# Patient Record
Sex: Female | Born: 1990 | Race: Black or African American | Hispanic: No | Marital: Single
Health system: Midwestern US, Community
[De-identification: ages and names within clinical notes are randomized; demographics above are authoritative.]

## PROBLEM LIST (undated history)

## (undated) ENCOUNTER — Inpatient Hospital Stay (HOSPITAL_COMMUNITY): Payer: Self-pay

## (undated) DIAGNOSIS — I10 Essential (primary) hypertension: Secondary | ICD-10-CM

## (undated) DIAGNOSIS — R569 Unspecified convulsions: Secondary | ICD-10-CM

## (undated) DIAGNOSIS — J45909 Unspecified asthma, uncomplicated: Secondary | ICD-10-CM

## (undated) DIAGNOSIS — I340 Nonrheumatic mitral (valve) insufficiency: Secondary | ICD-10-CM

## (undated) DIAGNOSIS — A749 Chlamydial infection, unspecified: Secondary | ICD-10-CM

## (undated) DIAGNOSIS — O99412 Diseases of the circulatory system complicating pregnancy, second trimester: Secondary | ICD-10-CM

## (undated) HISTORY — PX: NO PAST SURGERIES: SHX2092

---

## 2017-05-31 ENCOUNTER — Emergency Department (HOSPITAL_COMMUNITY): Payer: Medicaid Other

## 2017-05-31 ENCOUNTER — Encounter (HOSPITAL_COMMUNITY): Payer: Self-pay | Admitting: Emergency Medicine

## 2017-05-31 ENCOUNTER — Emergency Department (HOSPITAL_COMMUNITY)
Admission: EM | Admit: 2017-05-31 | Discharge: 2017-05-31 | Disposition: A | Discharge status: Home / Self Care | Payer: Medicaid Other | Attending: Physician Assistant | Admitting: Physician Assistant

## 2017-05-31 DIAGNOSIS — Z87891 Personal history of nicotine dependence: Secondary | ICD-10-CM | POA: Diagnosis not present

## 2017-05-31 DIAGNOSIS — J45909 Unspecified asthma, uncomplicated: Secondary | ICD-10-CM | POA: Diagnosis not present

## 2017-05-31 DIAGNOSIS — J069 Acute upper respiratory infection, unspecified: Secondary | ICD-10-CM | POA: Diagnosis not present

## 2017-05-31 DIAGNOSIS — R079 Chest pain, unspecified: Secondary | ICD-10-CM | POA: Diagnosis present

## 2017-05-31 HISTORY — DX: Unspecified convulsions: R56.9

## 2017-05-31 HISTORY — DX: Unspecified asthma, uncomplicated: J45.909

## 2017-05-31 LAB — CBC
HEMATOCRIT: 41.2 % (ref 36.0–46.0)
HEMOGLOBIN: 14.1 g/dL (ref 12.0–15.0)
MCH: 31.5 pg (ref 26.0–34.0)
MCHC: 34.2 g/dL (ref 30.0–36.0)
MCV: 92.2 fL (ref 78.0–100.0)
Platelets: 292 10*3/uL (ref 150–400)
RBC: 4.47 MIL/uL (ref 3.87–5.11)
RDW: 12.7 % (ref 11.5–15.5)
WBC: 9.2 10*3/uL (ref 4.0–10.5)

## 2017-05-31 LAB — BASIC METABOLIC PANEL
ANION GAP: 9 (ref 5–15)
BUN: 8 mg/dL (ref 6–20)
CHLORIDE: 105 mmol/L (ref 101–111)
CO2: 23 mmol/L (ref 22–32)
Calcium: 9 mg/dL (ref 8.9–10.3)
Creatinine, Ser: 0.86 mg/dL (ref 0.44–1.00)
GFR calc Af Amer: >60 mL/min (ref >60)
GLUCOSE: 100 mg/dL — AB (ref 65–99)
POTASSIUM: 3.8 mmol/L (ref 3.5–5.1)
Sodium: 137 mmol/L (ref 135–145)

## 2017-05-31 LAB — I-STAT TROPONIN, ED: Troponin i, poc: 0 ng/mL (ref 0.00–0.08)

## 2017-05-31 MED ORDER — IPRATROPIUM-ALBUTEROL 0.5-2.5 (3) MG/3ML IN SOLN
3.0000 mL | Freq: Once | RESPIRATORY_TRACT | Status: AC
  Administered 2017-05-31: 3 mL via RESPIRATORY_TRACT
  Filled 2017-05-31: qty 3

## 2017-05-31 MED ORDER — ALBUTEROL SULFATE HFA 108 (90 BASE) MCG/ACT IN AERS
1.0000 | INHALATION_SPRAY | RESPIRATORY_TRACT | Status: DC | PRN
  Administered 2017-05-31: 1 via RESPIRATORY_TRACT
  Filled 2017-05-31: qty 6.7

## 2017-05-31 NOTE — Discharge Instructions (Signed)
Please read attached information. If you experience any new or worsening signs or symptoms please return to the emergency room for evaluation. Please follow-up with your primary care provider or specialist as discussed. Please use medication prescribed only as directed and discontinue taking if you have any concerning signs or symptoms.   °

## 2017-05-31 NOTE — ED Provider Notes (Signed)
[facility]-EMERGENCY DEPT Provider Note   CSN: [id number] Arrival date & time: 05/31/17  1123     History   Chief Complaint Chief Complaint  Patient presents with  . Chest Pain    HPI Aracelie Addis is a 26 y.o. female.  HPI  26 year old female presents today with complaints of upper respiratory infection. Patient notes that yesterday she developed rhinorrhea, congestion, cough and chest tightness. Patient has a history of asthma, reports this feels similar to her previous exacerbation. Patient denies any audible wheezing but feels tightness in her chest. She denies any specific chest pain other than that noted with deep inspiration or palpation of left anterior chest wall. Patient denies any history of DVT or PE, denies any significant risk factors, denies any lower extremity swelling or edema. Patient reports that she had an inhaler at home but has run out and has not had any medications prior to arrival. She denies any fever. Patient reports she is a smoker.    Past Medical History:  Diagnosis Date  . Asthma   . Seizures (HCC)     There are no active problems to display for this patient.   History reviewed. No pertinent surgical history.  OB History    No data available       Home Medications    Prior to Admission medications   Not on File    Family History No family history on file.  Social History Social History  Substance Use Topics  . Smoking status: Current Every Day Smoker    Packs/day: 0.50  . Smokeless tobacco: Never Used  . Alcohol use Yes     Allergies   Patient has no allergy information on record.   Review of Systems Review of Systems  All other systems reviewed and are negative.    Physical Exam Updated Vital Signs BP 134/78 (BP Location: Left Arm)   Pulse 80   Temp 98.4 F (36.9 C) (Oral)   Resp 20   LMP 05/14/2017   SpO2 97%   Physical Exam  Constitutional: She is oriented to person, place, and time. She appears  well-developed and well-nourished.  HENT:  Head: Normocephalic and atraumatic.  Nose: Rhinorrhea present.  Eyes: Pupils are equal, round, and reactive to light. Conjunctivae are normal. Right eye exhibits no discharge. Left eye exhibits no discharge. No scleral icterus.  Neck: Normal range of motion. No JVD present. No tracheal deviation present.  Cardiovascular: Normal rate, regular rhythm, normal heart sounds and intact distal pulses.  Exam reveals no gallop and no friction rub.   No murmur heard. Pulmonary/Chest: Effort normal and breath sounds normal. No stridor. No respiratory distress. She has no wheezes. She has no rales. She exhibits no tenderness.  Neurological: She is alert and oriented to person, place, and time. Coordination normal.  Psychiatric: She has a normal mood and affect. Her behavior is normal. Judgment and thought content normal.  Nursing note and vitals reviewed.    ED Treatments / Results  Labs (all labs ordered are listed, but only abnormal results are displayed) Labs Reviewed  BASIC METABOLIC PANEL - Abnormal; Notable for the following:       Result Value   Glucose, Bld 100 (*)    All other components within normal limits  CBC  I-STAT TROPONIN, ED    EKG  EKG Interpretation None       Radiology Dg Chest 2 View  Result Date: 05/31/2017 CLINICAL DATA:  Sharp anterior chest pain with a pleuritic component  and productive cough for the past 2 days. No fever or shortness of breath. Current smoker. EXAM: CHEST  2 VIEW COMPARISON:  None in PACs. FINDINGS: The heart size and mediastinal contours are within normal limits. Both lungs are clear. The visualized skeletal structures are unremarkable. Nipple or months are present bilaterally. IMPRESSION: There is no active cardiopulmonary disease. Electronically Signed   By: David  SwazilandJordan M.D.   On: 05/31/2017 11:55    Procedures Procedures (including critical care time)  Medications Ordered in ED Medications    albuterol (PROVENTIL HFA;VENTOLIN HFA) 108 (90 Base) MCG/ACT inhaler 1-2 puff (1 puff Inhalation Given 05/31/17 1450)  ipratropium-albuterol (DUONEB) 0.5-2.5 (3) MG/3ML nebulizer solution 3 mL (3 mLs Nebulization Given 05/31/17 1450)     Initial Impression / Assessment and Plan / ED Course  I have reviewed the triage vital signs and the nursing notes.  Pertinent labs & imaging results that were available during my care of the patient were reviewed by me and considered in my medical decision making (see chart for details).      Final Clinical Impressions(s) / ED Diagnoses   Final diagnoses:  Viral URI    Labs: I-STAT troponin, BMP, CBC  Imaging: DG chest 2 view  Consults:  Therapeutics:  Discharge Meds:   Assessment/Plan: 26 year old female presents today with likely viral URI. She is afebrile nontoxic. She has reassuring lung sounds. Patient was given a breathing treatment for comfort measures here. Patient's chest discomfort is chest wall, very low suspicion for PE ACS in this otherwise healthy young female. She'll be discharged home with albuterol inhaler, symptomatic care structures and strict return precautions and she verbalized understanding and agreement to today's plan had no further questions or concerns at the time discharge    New Prescriptions New Prescriptions   No medications on file     Eyvonne MechanicHedges, Shey Yott, Cordelia Poche-C 05/31/17 1522    Abelino DerrickMackuen, Courteney Lyn, MD 06/01/17 1037

## 2017-05-31 NOTE — ED Triage Notes (Signed)
Pt reports sharp CP across front of chest, worse with respiration, increased cough with clear white sputum.  Pt denies fever/chills, SOB. Resp e/u at this time.

## 2017-05-31 NOTE — ED Notes (Signed)
Pt verbalized understanding discharge instructions and denies any further needs or questions at this time. VS stable, ambulatory and steady gait.   

## 2017-10-14 ENCOUNTER — Emergency Department: Admit: 2017-10-14 | Payer: MEDICAID

## 2017-10-14 ENCOUNTER — Inpatient Hospital Stay
Admit: 2017-10-14 | Discharge: 2017-10-14 | Disposition: A | Discharge status: Home / Self Care | Payer: MEDICAID | Attending: Emergency Medicine

## 2017-10-14 DIAGNOSIS — O26891 Other specified pregnancy related conditions, first trimester: Secondary | ICD-10-CM

## 2017-10-14 LAB — URINE MICROSCOPIC ONLY
RBC: 2 /hpf (ref 0–5)
WBC: 10 /hpf (ref 0–5)

## 2017-10-14 LAB — CBC WITH AUTOMATED DIFF
ABS. BASOPHILS: 0 10*3/uL (ref 0.0–0.1)
ABS. EOSINOPHILS: 0 10*3/uL (ref 0.0–0.4)
ABS. LYMPHOCYTES: 1.8 10*3/uL (ref 0.9–3.6)
ABS. MONOCYTES: 0.6 10*3/uL (ref 0.05–1.2)
ABS. NEUTROPHILS: 3.3 10*3/uL (ref 1.8–8.0)
BASOPHILS: 0 % (ref 0–2)
EOSINOPHILS: 1 % (ref 0–5)
HCT: 35.8 % (ref 35.0–45.0)
HGB: 12.1 g/dL (ref 12.0–16.0)
LYMPHOCYTES: 32 % (ref 21–52)
MCH: 31.2 PG (ref 24.0–34.0)
MCHC: 33.8 g/dL (ref 31.0–37.0)
MCV: 92.3 FL (ref 74.0–97.0)
MONOCYTES: 10 % (ref 3–10)
MPV: 10.4 FL (ref 9.2–11.8)
NEUTROPHILS: 57 % (ref 40–73)
PLATELET: 253 10*3/uL (ref 135–420)
RBC: 3.88 M/uL — ABNORMAL LOW (ref 4.20–5.30)
RDW: 11.5 % — ABNORMAL LOW (ref 11.6–14.5)
WBC: 5.7 10*3/uL (ref 4.6–13.2)

## 2017-10-14 LAB — METABOLIC PANEL, COMPREHENSIVE
A-G Ratio: 1 (ref 0.8–1.7)
ALT (SGPT): 27 U/L (ref 13–56)
AST (SGOT): 27 U/L (ref 15–37)
Albumin: 3.3 g/dL — ABNORMAL LOW (ref 3.4–5.0)
Alk. phosphatase: 79 U/L (ref 45–117)
Anion gap: 7 mmol/L (ref 3.0–18)
BUN/Creatinine ratio: 13
BUN: 10 MG/DL (ref 7.0–18)
Bilirubin, total: 0.3 MG/DL (ref 0.2–1.0)
CO2: 24 mmol/L (ref 21–32)
Calcium: 8.3 MG/DL — ABNORMAL LOW (ref 8.5–10.1)
Chloride: 106 mmol/L (ref 100–108)
Creatinine: 0.76 MG/DL (ref 0.6–1.3)
GFR est AA: >60 mL/min/{1.73_m2} (ref >60)
GFR est non-AA: >60 mL/min/{1.73_m2} (ref >60)
Globulin: 3.3 g/dL (ref 2.0–4.0)
Glucose: 80 mg/dL (ref 74–99)
Potassium: 3.8 mmol/L (ref 3.5–5.5)
Protein, total: 6.6 g/dL (ref 6.4–8.2)
Sodium: 137 mmol/L (ref 136–145)

## 2017-10-14 LAB — URINALYSIS W/ RFLX MICROSCOPIC
Bilirubin: NEGATIVE
Blood: NEGATIVE
Glucose: NEGATIVE mg/dL
Ketone: 40 mg/dL — AB
Nitrites: POSITIVE — AB
Protein: NEGATIVE mg/dL
Specific gravity: 1.026 (ref 1.005–1.030)
Urobilinogen: 1 EU/dL (ref 0.2–1.0)
pH (UA): 6 (ref 5.0–8.0)

## 2017-10-14 LAB — BLOOD TYPE, (ABO+RH)
ABO/Rh(D): O POS
ABO/Rh: O POS

## 2017-10-14 LAB — WET PREP

## 2017-10-14 LAB — BETA HCG, QT
Beta HCG, QT: 15974 m[IU]/mL — ABNORMAL HIGH (ref 0–10)
hCG Quant: 15974 m[IU]/mL — ABNORMAL HIGH (ref 0–10)

## 2017-10-14 LAB — HCG URINE, QL. - POC: Pregnancy test,urine (POC): POSITIVE — AB

## 2017-10-14 MED ORDER — CEPHALEXIN 500 MG CAP
500 mg | ORAL_CAPSULE | Freq: Two times a day (BID) | ORAL | 0 refills | Status: AC
Start: 2017-10-14 — End: 2017-10-21

## 2017-10-14 MED ORDER — ONDANSETRON 4 MG TAB, RAPID DISSOLVE
4 mg | ORAL_TABLET | Freq: Three times a day (TID) | ORAL | 0 refills | Status: AC | PRN
Start: 2017-10-14 — End: ?

## 2017-10-14 NOTE — ED Notes (Signed)
Urine preg POC=positive.

## 2017-10-14 NOTE — ED Notes (Signed)
I have reviewed discharge instructions with the patient.  The patient verbalized understanding.Patient ARMBAND removed @ bedside and placed in shredder.

## 2017-10-14 NOTE — ED Triage Notes (Signed)
C/o abd pain for 2 weeks with vomiting, vaginal discharge. Denies bleeding.

## 2017-10-14 NOTE — ED Provider Notes (Signed)
EMERGENCY DEPARTMENT HISTORY AND PHYSICAL EXAM    Date: 10/14/2017  Patient Name: Penny Rodriguez    History of Presenting Illness     Chief Complaint   Patient presents with   ??? Abdominal Pain         History Provided By: Patient    Chief Complaint: Lower back pain  Duration: 2 weeks  Timing:  Constant  Location: Lower back  Quality: Sharp  Severity: 4 out of 10  Associated Symptoms: pelvic pain radiating from the lower back, constipation, diarrhea (resolved), suprapubic abdominal pain, vaginal discharge "like a yeast infection"    Additional History (Context):   9:54 AM  Penny Rodriguez is a 26 y.o. female with PMHX asthma presents to the emergency department C/O sharp lower back pain (4/10) radiating to the pelvis onset 2 weeks ago. Associated sxs include constipation, diarrhea (resolved), suprapubic abdominal pain, vaginal discharge "like a yeast infection". LNMP 11/20, spotty. She states she uses condoms but not strictly, and does not use birth control otherwise. Pt states she has been told previously that she would be likely to have a high-risk pregnancy. Pt denies fever, dysuria, nausea, vomiting, SHx oophorectomy, appendectomy, PMHx ectopic pregnancy, and any other sxs or complaints.     PCP: None        Past History     Past Medical History:  Past Medical History:   Diagnosis Date   ??? Asthma        Past Surgical History:  Past Surgical History:   Procedure Laterality Date   ??? HX GYN         Family History:  No family history on file.    Social History:  Social History     Tobacco Use   ??? Smoking status: Not on file   Substance Use Topics   ??? Alcohol use: Not on file   ??? Drug use: Not on file       Allergies:  No Known Allergies    Review of Systems   Review of Systems   Constitutional: Negative for chills and fever.   Respiratory: Negative for shortness of breath.    Gastrointestinal: Positive for abdominal pain (suprapubic), constipation and diarrhea (resolved). Negative for nausea and vomiting.    Genitourinary: Positive for pelvic pain (radiating from the lower back) and vaginal discharge ("like a yeast infection"). Negative for difficulty urinating, dysuria, flank pain, frequency, hematuria, urgency and vaginal bleeding.   Musculoskeletal: Positive for back pain (sharp, lower).   Neurological: Negative for light-headedness and headaches.   All other systems reviewed and are negative.      Physical Exam     Vitals:    10/14/17 0947 10/14/17 1253   BP: 126/55 110/73   Pulse: 78 78   Resp: 16 16   Temp: 98.2 ??F (36.8 ??C)    SpO2: 100% 100%   Weight: 82.6 kg (182 lb)    Height: _0  (1.549 m)      Physical Exam   Constitutional: She is oriented to person, place, and time. She appears well-developed and well-nourished. No distress.   Female in NAD. Alert. Appears comfortable.    HENT:   Head: Normocephalic and atraumatic.   Right Ear: External ear normal. No swelling or tenderness. Tympanic membrane is not perforated, not erythematous and not bulging.   Left Ear: External ear normal. No swelling or tenderness. Tympanic membrane is not perforated, not erythematous and not bulging.   Nose: Nose normal. No mucosal edema or  rhinorrhea. Right sinus exhibits no maxillary sinus tenderness and no frontal sinus tenderness. Left sinus exhibits no maxillary sinus tenderness and no frontal sinus tenderness.   Mouth/Throat: Uvula is midline, oropharynx is clear and moist and mucous membranes are normal. No oral lesions. No trismus in the jaw. No dental abscesses or uvula swelling. No oropharyngeal exudate, posterior oropharyngeal edema, posterior oropharyngeal erythema or tonsillar abscesses.   Eyes: Conjunctivae are normal. Right eye exhibits no discharge. Left eye exhibits no discharge. No scleral icterus.   Neck: Normal range of motion.   Cardiovascular: Normal rate, regular rhythm, normal heart sounds and intact distal pulses. Exam reveals no gallop and no friction rub.   No murmur heard.   Pulmonary/Chest: Effort normal and breath sounds normal. No accessory muscle usage. No tachypnea. No respiratory distress. She has no decreased breath sounds. She has no wheezes. She has no rhonchi. She has no rales.   Abdominal: Soft. Normal appearance. She exhibits no ascites and no mass. There is no tenderness. There is no rigidity, no rebound, no guarding and no tenderness at McBurney's point.   Genitourinary:   Genitourinary Comments: Female chaperone in room.   See pelvic procedure note.   Verbal consent obtained prior to procedure.    Musculoskeletal: Normal range of motion.   Neurological: She is alert and oriented to person, place, and time.   Skin: Skin is warm and dry. No rash noted. She is not diaphoretic. No erythema.   Psychiatric: She has a normal mood and affect. Judgment normal.   Nursing note and vitals reviewed.        Diagnostic Study Results     Labs:     Recent Results (from the past 12 hour(s))   URINALYSIS W/ RFLX MICROSCOPIC    Collection Time: 10/14/17  9:50 AM   Result Value Ref Range    Color YELLOW      Appearance CLOUDY      Specific gravity 1.026 1.005 - 1.030      pH (UA) 6.0 5.0 - 8.0      Protein NEGATIVE  NEG mg/dL    Glucose NEGATIVE  NEG mg/dL    Ketone 40 (A) NEG mg/dL    Bilirubin NEGATIVE  NEG      Blood NEGATIVE  NEG      Urobilinogen 1.0 0.2 - 1.0 EU/dL    Nitrites POSITIVE (A) NEG      Leukocyte Esterase MODERATE (A) NEG     URINE MICROSCOPIC ONLY    Collection Time: 10/14/17  9:50 AM   Result Value Ref Range    WBC 10 to 15 0 - 5 /hpf    RBC 2 to 4 0 - 5 /hpf    Epithelial cells 3+ 0 - 5 /lpf    Bacteria 2+ (A) NEG /hpf    Mucus 2+ (A) NEG /lpf    Trichomonas 1+ (A) NEG   HCG URINE, QL. - POC    Collection Time: 10/14/17  9:56 AM   Result Value Ref Range    Pregnancy test,urine (POC) POSITIVE (A) NEG     CBC WITH AUTOMATED DIFF    Collection Time: 10/14/17 10:20 AM   Result Value Ref Range    WBC 5.7 4.6 - 13.2 K/uL    RBC 3.88 (L) 4.20 - 5.30 M/uL     HGB 12.1 12.0 - 16.0 g/dL    HCT 35.8 35.0 - 45.0 %    MCV 92.3 74.0 - 97.0 FL  MCH 31.2 24.0 - 34.0 PG    MCHC 33.8 31.0 - 37.0 g/dL    RDW 11.5 (L) 11.6 - 14.5 %    PLATELET 253 135 - 420 K/uL    MPV 10.4 9.2 - 11.8 FL    NEUTROPHILS 57 40 - 73 %    LYMPHOCYTES 32 21 - 52 %    MONOCYTES 10 3 - 10 %    EOSINOPHILS 1 0 - 5 %    BASOPHILS 0 0 - 2 %    ABS. NEUTROPHILS 3.3 1.8 - 8.0 K/UL    ABS. LYMPHOCYTES 1.8 0.9 - 3.6 K/UL    ABS. MONOCYTES 0.6 0.05 - 1.2 K/UL    ABS. EOSINOPHILS 0.0 0.0 - 0.4 K/UL    ABS. BASOPHILS 0.0 0.0 - 0.1 K/UL    DF AUTOMATED     METABOLIC PANEL, COMPREHENSIVE    Collection Time: 10/14/17 10:20 AM   Result Value Ref Range    Sodium 137 136 - 145 mmol/L    Potassium 3.8 3.5 - 5.5 mmol/L    Chloride 106 100 - 108 mmol/L    CO2 24 21 - 32 mmol/L    Anion gap 7 3.0 - 18 mmol/L    Glucose 80 74 - 99 mg/dL    BUN 10 7.0 - 18 MG/DL    Creatinine 0.76 0.6 - 1.3 MG/DL    BUN/Creatinine ratio 13      GFR est AA >60 >60 ml/min/1.87m    GFR est non-AA >60 >60 ml/min/1.717m   Calcium 8.3 (L) 8.5 - 10.1 MG/DL    Bilirubin, total 0.3 0.2 - 1.0 MG/DL    ALT (SGPT) 27 13 - 56 U/L    AST (SGOT) 27 15 - 37 U/L    Alk. phosphatase 79 45 - 117 U/L    Protein, total 6.6 6.4 - 8.2 g/dL    Albumin 3.3 (L) 3.4 - 5.0 g/dL    Globulin 3.3 2.0 - 4.0 g/dL    A-G Ratio 1.0 0.8 - 1.7     BETA HCG, QT    Collection Time: 10/14/17 10:20 AM   Result Value Ref Range    Beta HCG, QT 15,974 (H) 0 - 10 MIU/ML   BLOOD TYPE, (ABO+RH)    Collection Time: 10/14/17 10:20 AM   Result Value Ref Range    ABO/Rh(D) O Jenetta DownerOSITIVE    WET PREP    Collection Time: 10/14/17 11:15 AM   Result Value Ref Range    Special Requests: NO SPECIAL REQUESTS      Wet prep FEW  WBC'S        Wet prep NO YEAST,TRICHOMONAS OR CLUE CELLS NOTED         Radiologic Studies:     11:13 AM  ULTRASOUND FINDINGS  Transvaginal [place]ION:  1. Single live intrauterine gestation that corresponds to an estimated 6  week 0 day gestation by crown-rump length measurement with fetal heart tones of 130 bpm. No identified subchorionic hemorrhage.  2. Suspected left ovarian corpus luteum.  3. Nonspecific small volume of pelvic free fluid.  Recorded by AnJeralene PetersED Scribe, as dictated by TaDionne BucyPA-C    [place] TRANSVAGINAL OB   Final Result   IMPRESSION:      1. Single live intrauterine gestation that corresponds to an estimated 6 week 0   day gestation by crown-rump length measurement with fetal heart tones of 130   bpm. No identified subchorionic hemorrhage.  2. Suspected left ovarian corpus luteum.      3. Nonspecific small volume of pelvic free fluid.                 CT Results  (Last 48 hours)    None        CXR Results  (Last 48 hours)    None          MEDICATIONS GIVEN:  Medications - No data to display     Medical Decision Making   I am the first provider for this patient.    I reviewed the vital signs, available nursing notes, past medical history, past surgical history, family history and social history.    Vital Signs: Reviewed the patient's vital signs.    Pulse Oximetry Analysis: 100% on RA    Records Reviewed: Nursing Notes and Old Medical Records    Provider Notes (Medical Decision Making): ectopic, miscarriage, UTI, STI/PID, BV, trich, MSK    Procedures:   Pelvic Exam  Date/Time: 10/14/2017 11:17 AM  Performed by: PA  Documented by:  Anastasia Demson.   Type of exam performed: bimanual and speculum.    External genitalia appearance: normal.    Vaginal exam:  discharge.    The discharge was white.   Cervical exam:  normal.    Specimen(s) collected:  chlamydia, vaginal culture and GC.  Bimanual exam:  left adenexal tenderness.    Patient tolerance: Patient tolerated the procedure well with no immediate complications          ED Course:   9:54 AM Initial assessment performed. The patients presenting problems have been discussed, and they are in agreement with the care plan  formulated and outlined with them.  I have encouraged them to ask questions as they arise throughout their visit.    10:05 AM Pt updated on positive UPT, will need ultrasound. Benign abdomen. No vaginal bleeding.     Living IUP. Unremarkable pelvic. Will tx with ABX for UTI and await culture. No evidence of upper urinary tract involvement. Reasons to RTED discussed with pt. All questions answered. Pt feels comfortable going home at this time. Pt expressed understanding and she agrees with plan.      Diagnosis and Disposition     DISCHARGE NOTE:  12:14 PM  Lazaria Schaben Detzel's  results have been reviewed with her.  She has been counseled regarding her diagnosis, treatment, and plan.  She verbally conveys understanding and agreement of the signs, symptoms, diagnosis, treatment and prognosis and additionally agrees to follow up as discussed.  She also agrees with the care-plan and conveys that all of her questions have been answered.  I have also provided discharge instructions for her that include: educational information regarding their diagnosis and treatment, and list of reasons why they would want to return to the ED prior to their follow-up appointment, should her condition change. She has been provided with education for proper emergency department utilization.     CLINICAL IMPRESSION:    1. Pelvic pain affecting pregnancy in first trimester, antepartum    2. Acute cystitis with hematuria        PLAN:  1. D/C Home  2.   Discharge Medication List as of 10/14/2017 12:16 PM      START taking these medications    Details   cephALEXin (KEFLEX) 500 mg capsule Take 1 Cap by mouth two (2) times a day for 7 days., Print, Disp-14 Cap, R-0      ondansetron (ZOFRAN  ODT) 4 mg disintegrating tablet Take 1 Tab by mouth every eight (8) hours as needed for Nausea., Print, Disp-12 Tab, R-0         CONTINUE these medications which have NOT CHANGED    Details   albuterol (PROVENTIL HFA, VENTOLIN HFA, PROAIR HFA) 90 mcg/actuation  inhaler Take  by inhalation., Historical Med           3.   Follow-up Information     Follow up With Specialties Details Why Contact Info    Lappinen, Evlyn Kanner, MD Obstetrics & Gynecology, Gynecology, Obstetrics Schedule an appointment as soon as possible for a visit in 2 days Follow up with OBGYN. [place] [address]  [phone]      [place]  Schedule an appointment as soon as possible for a visit in 2 days Follow up with [facility], a free/low cost clinic. [place], [place]    [facility] EMERGENCY DEPT Emergency Medicine Go to As needed, If symptoms worsen [address]  [place] [address]  [phone]        _______________________________     Attestations:     SCRIBE ATTESTATION:  This note is prepared by Jeralene Peters, acting as Scribe for Dionne Bucy, PA-C.    PROVIDER ATTESTATION:  Dionne Bucy, PA-C: The scribe's documentation has been prepared under my direction and personally reviewed by me in its entirety. I confirm that the note above accurately reflects all work, treatment, procedures, and medical decision making performed by me.   _______________________________

## 2017-10-14 NOTE — ED Notes (Signed)
Warm blanket provided.

## 2017-10-17 LAB — CULTURE, URINE: Culture result:: >100000 — AB

## 2017-10-18 LAB — CHLAMYDIA/NEISSERIA AMPLIFICATION
Chlamydia amplification: NEGATIVE
N. gonorrhoeae amplification: NEGATIVE

## 2018-03-26 LAB — OB RESULTS CONSOLE RPR: RPR: NONREACTIVE

## 2018-03-26 LAB — OB RESULTS CONSOLE RUBELLA ANTIBODY, IGM: Rubella: NON-IMMUNE/NOT IMMUNE

## 2018-03-26 LAB — OB RESULTS CONSOLE HIV ANTIBODY (ROUTINE TESTING): HIV: NONREACTIVE

## 2018-03-26 LAB — OB RESULTS CONSOLE HEPATITIS B SURFACE ANTIGEN: Hepatitis B Surface Ag: NEGATIVE

## 2018-03-26 LAB — OB RESULTS CONSOLE ANTIBODY SCREEN: ANTIBODY SCREEN: NEGATIVE

## 2018-03-26 LAB — OB RESULTS CONSOLE ABO/RH: RH TYPE: POSITIVE

## 2018-04-14 ENCOUNTER — Emergency Department (HOSPITAL_COMMUNITY)
Admission: EM | Admit: 2018-04-14 | Discharge: 2018-04-14 | Disposition: A | Discharge status: Home / Self Care | Payer: Medicaid - Out of State | Attending: Emergency Medicine | Admitting: Emergency Medicine

## 2018-04-14 ENCOUNTER — Emergency Department (HOSPITAL_COMMUNITY): Payer: Medicaid - Out of State

## 2018-04-14 ENCOUNTER — Encounter (HOSPITAL_COMMUNITY): Payer: Self-pay | Admitting: Emergency Medicine

## 2018-04-14 DIAGNOSIS — O9952 Diseases of the respiratory system complicating childbirth: Secondary | ICD-10-CM | POA: Diagnosis present

## 2018-04-14 DIAGNOSIS — Z3A34 34 weeks gestation of pregnancy: Secondary | ICD-10-CM

## 2018-04-14 DIAGNOSIS — J209 Acute bronchitis, unspecified: Secondary | ICD-10-CM | POA: Diagnosis not present

## 2018-04-14 DIAGNOSIS — J4541 Moderate persistent asthma with (acute) exacerbation: Secondary | ICD-10-CM | POA: Diagnosis not present

## 2018-04-14 DIAGNOSIS — J40 Bronchitis, not specified as acute or chronic: Secondary | ICD-10-CM

## 2018-04-14 HISTORY — DX: Essential (primary) hypertension: I10

## 2018-04-14 LAB — URINALYSIS, ROUTINE W REFLEX MICROSCOPIC
GLUCOSE, UA: NEGATIVE mg/dL
Hgb urine dipstick: NEGATIVE
KETONES UR: 20 mg/dL — AB
NITRITE: NEGATIVE
PH: 6 (ref 5.0–8.0)
Protein, ur: 30 mg/dL — AB
SPECIFIC GRAVITY, URINE: 1.021 (ref 1.005–1.030)

## 2018-04-14 LAB — POCT FERN TEST: POCT Fern Test: NEGATIVE

## 2018-04-14 MED ORDER — PREDNISONE 10 MG PO TABS: 40.0000 mg | ORAL_TABLET | Freq: Every day | ORAL | 0 refills | Status: DC

## 2018-04-14 MED ORDER — ONDANSETRON HCL 4 MG/2ML IJ SOLN
4.0000 mg | Freq: Once | INTRAMUSCULAR | Status: AC
  Administered 2018-04-14: 4 mg via INTRAVENOUS
  Filled 2018-04-14: qty 2

## 2018-04-14 MED ORDER — SODIUM CHLORIDE 0.9 % IV SOLN
INTRAVENOUS | Status: DC
  Administered 2018-04-14: 11:00:00 via INTRAVENOUS

## 2018-04-14 MED ORDER — ALBUTEROL SULFATE (2.5 MG/3ML) 0.083% IN NEBU
2.5000 mg | INHALATION_SOLUTION | Freq: Once | RESPIRATORY_TRACT | Status: AC
  Administered 2018-04-14: 2.5 mg via RESPIRATORY_TRACT
  Filled 2018-04-14: qty 3

## 2018-04-14 MED ORDER — SODIUM CHLORIDE 0.9 % IV BOLUS
1000.0000 mL | Freq: Once | INTRAVENOUS | Status: AC
  Administered 2018-04-14: 1000 mL via INTRAVENOUS

## 2018-04-14 MED ORDER — ALBUTEROL SULFATE HFA 108 (90 BASE) MCG/ACT IN AERS
2.0000 | INHALATION_SPRAY | Freq: Four times a day (QID) | RESPIRATORY_TRACT | Status: DC
  Administered 2018-04-14: 2 via RESPIRATORY_TRACT
  Filled 2018-04-14: qty 6.7

## 2018-04-14 MED ORDER — METHYLPREDNISOLONE SODIUM SUCC 125 MG IJ SOLR
125.0000 mg | Freq: Once | INTRAMUSCULAR | Status: AC
  Administered 2018-04-14: 125 mg via INTRAVENOUS
  Filled 2018-04-14: qty 2

## 2018-04-14 NOTE — ED Triage Notes (Addendum)
Pt reports she is [redacted] weeks pregnant, states she last felt the baby move sometime early yesterday, also reports a "small gush of fluid" and some mild cramping. Fetal heart tones 154 in triage.

## 2018-04-14 NOTE — ED Provider Notes (Addendum)
[facility] EMERGENCY DEPARTMENT Provider Note   CSN: [id number] Arrival date & time: 04/14/18  0941     History   Chief Complaint Chief Complaint  Patient presents with  . pregnancy complication    HPI Carrie Herrera is a 27 y.o. female.  Patient is approximately [redacted] weeks pregnant new to town.  Patient had difficulty with 1 or 2 episodes of vomiting for the past 3 days.  Patient is also had a cough and congestion.  No fevers.  No abdominal pain.  The patient got concerned because she had not felt the baby move since yesterday and she had some leakage of fluid today which she thought was amniotic fluid.  No vaginal bleeding.  Patient has a history of asthma but is been out of albuterol inhaler for several weeks.  Patient will be staying in town to have a baby delivered here.  Rapid response OB nurse called to evaluate patient as well.     Past Medical History:  Diagnosis Date  . Asthma   . Hypertension    during pregnancy   . Seizures (HCC)     There are no active problems to display for this patient.   Past Surgical History:  Procedure Laterality Date  . NO PAST SURGERIES       OB History    Gravida  2   Para  1   Term  1   Preterm  0   AB  0   Living  1     SAB  0   TAB  0   Ectopic  0   Multiple  0   Live Births  1            Home Medications    Prior to Admission medications   Medication Sig Start Date End Date Taking? Authorizing Provider  ferrous sulfate 325 (65 FE) MG tablet Take 325 mg by mouth daily with breakfast.   Yes [provider]  guaiFENesin (MUCINEX) 600 MG 12 hr tablet Take 600 mg by mouth 2 (two) times daily as needed for cough or to loosen phlegm.   Yes [provider]  Omega-3 Fatty Acids (FISH OIL) 1000 MG CAPS Take 1 capsule by mouth daily.   Yes [provider]  PRENATAL 28-0.8 MG TABS Take 1 tablet by mouth daily.   Yes [provider]  predniSONE (DELTASONE) 10  MG tablet Take 4 tablets (40 mg total) by mouth daily. 04/14/18   Vanetta Mulders, MD    Family History History reviewed. No pertinent family history.  Social History Social History   Tobacco Use  . Smoking status: Current Every Day Smoker    Packs/day: 0.50  . Smokeless tobacco: Never Used  Substance Use Topics  . Alcohol use: Yes  . Drug use: Yes    Types: Marijuana     Allergies   Patient has no known allergies.   Review of Systems Review of Systems  Constitutional: Negative for fever.  HENT: Positive for congestion.   Eyes: Negative for redness.  Respiratory: Positive for cough, chest tightness, shortness of breath and wheezing.   Cardiovascular: Negative for chest pain.  Gastrointestinal: Positive for nausea and vomiting. Negative for abdominal pain.  Genitourinary: Positive for vaginal discharge. Negative for dysuria and pelvic pain.  Musculoskeletal: Negative for back pain.  Skin: Negative for rash.  Neurological: Negative for syncope and headaches.  Hematological: Does not bruise/bleed easily.  Psychiatric/Behavioral: Negative for confusion.  Physical Exam Updated Vital Signs BP 103/81 (BP Location: Right Arm)   Pulse 99   Temp 97.8 F (36.6 C) (Oral)   Resp 18   Ht 1.549 m (5\' 1" )   Wt 86.2 kg (190 lb)   LMP 05/14/2017   SpO2 100%   BMI 35.90 kg/m   Physical Exam  Constitutional: She is oriented to person, place, and time. She appears well-developed and well-nourished. She appears distressed.  HENT:  Head: Normocephalic and atraumatic.  Mouth/Throat: Oropharynx is clear and moist.  Eyes: Pupils are equal, round, and reactive to light. Conjunctivae and EOM are normal.  Neck: Neck supple.  Cardiovascular: Normal rate.  Pulmonary/Chest: She is in respiratory distress. She has wheezes.  Abdominal: Soft. Bowel sounds are normal. There is no tenderness.  Gravid abdomen  Genitourinary:  Genitourinary Comments: Bimanual pelvic exam deferred to  rapid response OB.  Musculoskeletal: Normal range of motion. She exhibits no edema.  Neurological: She is alert and oriented to person, place, and time. No cranial nerve deficit or sensory deficit. Coordination normal.  Skin: Skin is warm.  Nursing note and vitals reviewed.    ED Treatments / Results  Labs (all labs ordered are listed, but only abnormal results are displayed) Labs Reviewed  URINALYSIS, ROUTINE W REFLEX MICROSCOPIC - Abnormal; Notable for the following components:      Result Value   Color, Urine AMBER (*)    APPearance HAZY (*)    Bilirubin Urine SMALL (*)    Ketones, ur 20 (*)    Protein, ur 30 (*)    Leukocytes, UA SMALL (*)    Bacteria, UA FEW (*)    All other components within normal limits  POCT FERN TEST    EKG None  Radiology Dg Chest Port 1 View  Result Date: 04/14/2018 CLINICAL DATA:  Cough and congestion for 3 days. EXAM: PORTABLE CHEST 1 VIEW COMPARISON:  05/31/2017 and prior radiographs FINDINGS: The cardiomediastinal silhouette is unremarkable. There is no evidence of focal airspace disease, pulmonary edema, suspicious pulmonary nodule/mass, pleural effusion, or pneumothorax. No acute bony abnormalities are identified. IMPRESSION: No active disease. Electronically Signed   By: Harmon Pier M.D.   On: 04/14/2018 10:59    Procedures Procedures (including critical care time)  CRITICAL CARE Performed by: Vanetta Mulders Total critical care time: 30 minutes Critical care time was exclusive of separately billable procedures and treating other patients. Critical care was necessary to treat or prevent imminent or life-threatening deterioration. Critical care was time spent personally by me on the following activities: development of treatment plan with patient and/or surrogate as well as nursing, discussions with consultants, evaluation of patient's response to treatment, examination of patient, obtaining history from patient or surrogate, ordering and  performing treatments and interventions, ordering and review of laboratory studies, ordering and review of radiographic studies, pulse oximetry and re-evaluation of patient's condition.   Medications Ordered in ED Medications  0.9 %  sodium chloride infusion ( Intravenous Stopped 04/14/18 1413)  albuterol (PROVENTIL HFA;VENTOLIN HFA) 108 (90 Base) MCG/ACT inhaler 2 puff (has no administration in time range)  sodium chloride 0.9 % bolus 1,000 mL (0 mLs Intravenous Stopped 04/14/18 1148)  albuterol (PROVENTIL) (2.5 MG/3ML) 0.083% nebulizer solution 2.5 mg (2.5 mg Nebulization Given 04/14/18 1054)  albuterol (PROVENTIL) (2.5 MG/3ML) 0.083% nebulizer solution 2.5 mg (2.5 mg Nebulization Given 04/14/18 1319)  methylPREDNISolone sodium succinate (SOLU-MEDROL) 125 mg/2 mL injection 125 mg (125 mg Intravenous Given 04/14/18 1301)  ondansetron (ZOFRAN) injection 4 mg (4 mg  Intravenous Given 04/14/18 1315)     Initial Impression / Assessment and Plan / ED Course  I have reviewed the triage vital signs and the nursing notes.  Pertinent labs & imaging results that were available during my care of the patient were reviewed by me and considered in my medical decision making (see chart for details).     Patient is [redacted] weeks pregnant due first part of August.  New to town.  Does not have any OB/GYN follow-up.  Patient's been concerned because she had a gush of fluid and has not heard felt the baby move since sometime yesterday.  Patient also suffering from some nausea and vomiting upper respiratory infection with significant cough.  No fevers no abdominal pain no bleeding.  Patient evaluated by rapid response OB.  They did affirm testing does not have any manic fluid present.  On exam it seemed to be leakage of urine when she coughed.  Fetal heart tones were good.  Cleared by rapid response OB nurse for our evaluation and discharge home and then they will plug her in for [facility]  No  indication for additional OB/GYN monitoring.  Patient here was wheezing when she first arrived patient given albuterol  nebulizers x2 wheezing resolved patient given a dose of Solu-Medrol.  Cough improving.  Chest x-ray negative for pneumonia.  Patient be discharged home with albuterol inhaler use every 6 hours for the next 7 days and as needed and patient will be continued on prednisone for 5 days.  Patient does have a history of asthma and is been out of albuterol inhaler for some time.  Patient feeling much better at the time of discharge.  Patient fetal heart tones were in the 150 range.  In addition rapid response OB although they determined no amniotic fluid the also was no concerns for her being in labor.  Bimanual exam was done by them.  Patient also without hypertension  Patient is also had few episodes of nausea and vomiting for the past few days.  May have been posttussive.  Final Clinical Impressions(s) / ED Diagnoses   Final diagnoses:  [redacted] weeks gestation of pregnancy  Bronchitis  Moderate persistent asthma with acute exacerbation    ED Discharge Orders        Ordered    predniSONE (DELTASONE) 10 MG tablet  Daily     04/14/18 1412       Vanetta MuldersZackowski, Colette Dicamillo, MD 04/14/18 1429    Vanetta MuldersZackowski, Yomar Mejorado, MD 04/14/18 1430

## 2018-04-14 NOTE — Progress Notes (Signed)
Came to room to give inhaler, RN at bedside & gave inhaler.

## 2018-04-14 NOTE — Progress Notes (Signed)
1148 ED RN, Clydie BraunKaren notified of negative fern POC testing and that pt is OB cleared.

## 2018-04-14 NOTE — Discharge Instructions (Addendum)
Use albuterol inhaler 2 puffs every 6 hours as directed for the next 7 days at least and as needed.  Take the prednisone as directed for the next 5 days.  [facility].

## 2018-04-14 NOTE — Progress Notes (Signed)
1010 Arrived to evaluate this 27 yo G2P1 @ 34.[redacted] wks [place] in with report of coughing, abdominal pain, and possible LOF.  [facility] [place]  She reports LOF possibly for 2 days. Reports decreased fetal movement. SVE closed. Pt noted to urinate with coughing with SVE, No LOF from vagina noted. No vaginal bleeding noted. Fetal movement palpated. FHR Category I with no UC's noted.Pt complains of cough and is noted to have wheezing on auscultation. She has a history of asthma and has had a respiratory illness.1040 Dr. Penne LashLeggett notified of above.  Orders for bedside collection of fern test which will then be transported to MAU by [facility] RN for reading by staff RN.  Verbal orders to take pt of EFM.  If fern testing is negative then this will be communicated with ED staff and pt will then be OB cleared.

## 2018-04-14 NOTE — Progress Notes (Signed)
1145 [facility] placed by Delray AltHeather Donavan, CNM.  Pt with care in [place], will be staying in [place], requested referral.

## 2018-04-19 DIAGNOSIS — Z2839 Other underimmunization status: Secondary | ICD-10-CM | POA: Insufficient documentation

## 2018-04-19 DIAGNOSIS — O09899 Supervision of other high risk pregnancies, unspecified trimester: Secondary | ICD-10-CM | POA: Insufficient documentation

## 2018-04-19 DIAGNOSIS — O9989 Other specified diseases and conditions complicating pregnancy, childbirth and the puerperium: Secondary | ICD-10-CM

## 2018-04-19 DIAGNOSIS — Z283 Underimmunization status: Secondary | ICD-10-CM | POA: Insufficient documentation

## 2018-04-20 ENCOUNTER — Inpatient Hospital Stay (HOSPITAL_COMMUNITY): Payer: Medicaid - Out of State

## 2018-04-20 ENCOUNTER — Inpatient Hospital Stay (HOSPITAL_COMMUNITY)
Admission: AD | Admit: 2018-04-20 | Discharge: 2018-04-20 | Disposition: A | Discharge status: Home / Self Care | Payer: Medicaid - Out of State | Source: Ambulatory Visit | Attending: Obstetrics and Gynecology | Admitting: Obstetrics and Gynecology

## 2018-04-20 ENCOUNTER — Encounter (HOSPITAL_COMMUNITY): Payer: Self-pay

## 2018-04-20 DIAGNOSIS — O99412 Diseases of the circulatory system complicating pregnancy, second trimester: Secondary | ICD-10-CM

## 2018-04-20 DIAGNOSIS — O26893 Other specified pregnancy related conditions, third trimester: Secondary | ICD-10-CM | POA: Insufficient documentation

## 2018-04-20 DIAGNOSIS — R059 Cough, unspecified: Secondary | ICD-10-CM

## 2018-04-20 DIAGNOSIS — K5900 Constipation, unspecified: Secondary | ICD-10-CM | POA: Diagnosis not present

## 2018-04-20 DIAGNOSIS — O99613 Diseases of the digestive system complicating pregnancy, third trimester: Secondary | ICD-10-CM | POA: Insufficient documentation

## 2018-04-20 DIAGNOSIS — F1290 Cannabis use, unspecified, uncomplicated: Secondary | ICD-10-CM | POA: Diagnosis not present

## 2018-04-20 DIAGNOSIS — Z0371 Encounter for suspected problem with amniotic cavity and membrane ruled out: Secondary | ICD-10-CM

## 2018-04-20 DIAGNOSIS — R1032 Left lower quadrant pain: Secondary | ICD-10-CM | POA: Diagnosis not present

## 2018-04-20 DIAGNOSIS — Z599 Problem related to housing and economic circumstances, unspecified: Secondary | ICD-10-CM

## 2018-04-20 DIAGNOSIS — O133 Gestational [pregnancy-induced] hypertension without significant proteinuria, third trimester: Secondary | ICD-10-CM | POA: Insufficient documentation

## 2018-04-20 DIAGNOSIS — Z59819 Housing instability, housed unspecified: Secondary | ICD-10-CM

## 2018-04-20 DIAGNOSIS — O99513 Diseases of the respiratory system complicating pregnancy, third trimester: Secondary | ICD-10-CM | POA: Diagnosis not present

## 2018-04-20 DIAGNOSIS — O0930 Supervision of pregnancy with insufficient antenatal care, unspecified trimester: Secondary | ICD-10-CM

## 2018-04-20 DIAGNOSIS — R0989 Other specified symptoms and signs involving the circulatory and respiratory systems: Secondary | ICD-10-CM

## 2018-04-20 DIAGNOSIS — J4541 Moderate persistent asthma with (acute) exacerbation: Secondary | ICD-10-CM | POA: Diagnosis not present

## 2018-04-20 DIAGNOSIS — Z3A35 35 weeks gestation of pregnancy: Secondary | ICD-10-CM | POA: Insufficient documentation

## 2018-04-20 DIAGNOSIS — O09899 Supervision of other high risk pregnancies, unspecified trimester: Secondary | ICD-10-CM

## 2018-04-20 DIAGNOSIS — R05 Cough: Secondary | ICD-10-CM

## 2018-04-20 DIAGNOSIS — I340 Nonrheumatic mitral (valve) insufficiency: Secondary | ICD-10-CM

## 2018-04-20 DIAGNOSIS — O99019 Anemia complicating pregnancy, unspecified trimester: Secondary | ICD-10-CM | POA: Diagnosis present

## 2018-04-20 HISTORY — DX: Chlamydial infection, unspecified: A74.9

## 2018-04-20 HISTORY — DX: Nonrheumatic mitral (valve) insufficiency: I34.0

## 2018-04-20 HISTORY — DX: Diseases of the circulatory system complicating pregnancy, second trimester: O99.412

## 2018-04-20 LAB — WET PREP, GENITAL
SPERM: NONE SEEN
TRICH WET PREP: NONE SEEN
Yeast Wet Prep HPF POC: NONE SEEN

## 2018-04-20 LAB — COMPREHENSIVE METABOLIC PANEL
ALT: 17 U/L (ref 0–44)
AST: 22 U/L (ref 15–41)
Albumin: 2.7 g/dL — ABNORMAL LOW (ref 3.5–5.0)
Alkaline Phosphatase: 120 U/L (ref 38–126)
Anion gap: 11 (ref 5–15)
BILIRUBIN TOTAL: 0.2 mg/dL — AB (ref 0.3–1.2)
CHLORIDE: 105 mmol/L (ref 98–111)
CO2: 20 mmol/L — ABNORMAL LOW (ref 22–32)
CREATININE: 0.52 mg/dL (ref 0.44–1.00)
Calcium: 8.3 mg/dL — ABNORMAL LOW (ref 8.9–10.3)
GFR calc Af Amer: >60 mL/min (ref >60)
Glucose, Bld: 90 mg/dL (ref 70–99)
Potassium: 3.3 mmol/L — ABNORMAL LOW (ref 3.5–5.1)
Sodium: 136 mmol/L (ref 135–145)
TOTAL PROTEIN: 5.6 g/dL — AB (ref 6.5–8.1)

## 2018-04-20 LAB — URINALYSIS, ROUTINE W REFLEX MICROSCOPIC
BILIRUBIN URINE: NEGATIVE
Glucose, UA: NEGATIVE mg/dL
Hgb urine dipstick: NEGATIVE
Ketones, ur: NEGATIVE mg/dL
NITRITE: NEGATIVE
PH: 8 (ref 5.0–8.0)
Protein, ur: NEGATIVE mg/dL
SPECIFIC GRAVITY, URINE: 1.008 (ref 1.005–1.030)

## 2018-04-20 LAB — CBC
HCT: 28.1 % — ABNORMAL LOW (ref 36.0–46.0)
Hemoglobin: 9.6 g/dL — ABNORMAL LOW (ref 12.0–15.0)
MCH: 31 pg (ref 26.0–34.0)
MCHC: 34.2 g/dL (ref 30.0–36.0)
MCV: 90.6 fL (ref 78.0–100.0)
PLATELETS: 346 10*3/uL (ref 150–400)
RBC: 3.1 MIL/uL — ABNORMAL LOW (ref 3.87–5.11)
RDW: 13 % (ref 11.5–15.5)
WBC: 12.4 10*3/uL — AB (ref 4.0–10.5)

## 2018-04-20 LAB — PROTEIN / CREATININE RATIO, URINE
Creatinine, Urine: 96 mg/dL
PROTEIN CREATININE RATIO: 0.09 mg/mg{creat} (ref 0.00–0.15)
Total Protein, Urine: 9 mg/dL

## 2018-04-20 LAB — POCT FERN TEST: POCT FERN TEST: NEGATIVE

## 2018-04-20 MED ORDER — LABETALOL HCL 5 MG/ML IV SOLN: 20.0000 mg | INTRAVENOUS | Status: DC | PRN

## 2018-04-20 MED ORDER — ALBUTEROL SULFATE (2.5 MG/3ML) 0.083% IN NEBU
2.5000 mg | INHALATION_SOLUTION | Freq: Once | RESPIRATORY_TRACT | Status: AC
  Administered 2018-04-20: 2.5 mg via RESPIRATORY_TRACT
  Filled 2018-04-20: qty 3

## 2018-04-20 MED ORDER — MOMETASONE FURO-FORMOTEROL FUM 200-5 MCG/ACT IN AERO
2.0000 | INHALATION_SPRAY | Freq: Once | RESPIRATORY_TRACT | Status: AC
  Administered 2018-04-20: 2 via RESPIRATORY_TRACT
  Filled 2018-04-20: qty 8.8

## 2018-04-20 MED ORDER — LACTATED RINGERS IV BOLUS
1000.0000 mL | Freq: Once | INTRAVENOUS | Status: AC
  Administered 2018-04-20: 1000 mL via INTRAVENOUS

## 2018-04-20 MED ORDER — MOMETASONE FURO-FORMOTEROL FUM 200-5 MCG/ACT IN AERO: 2.0000 | INHALATION_SPRAY | Freq: Two times a day (BID) | RESPIRATORY_TRACT | 1 refills | Status: AC

## 2018-04-20 MED ORDER — MOMETASONE FURO-FORMOTEROL FUM 200-5 MCG/ACT IN AERO: 2.0000 | INHALATION_SPRAY | Freq: Two times a day (BID) | RESPIRATORY_TRACT | Status: DC

## 2018-04-20 MED ORDER — HYDRALAZINE HCL 20 MG/ML IJ SOLN: 10.0000 mg | Freq: Once | INTRAMUSCULAR | Status: DC | PRN

## 2018-04-20 NOTE — Discharge Instructions (Signed)
Hypertension During Pregnancy Hypertension is also called high blood pressure. High blood pressure means that the force of your blood moving in your body is too strong. When you are pregnant, this condition should be watched carefully. It can cause problems for you and your baby. Follow these instructions at home: Eating and drinking  Drink enough fluid to keep your pee (urine) clear or pale yellow.  Eat healthy foods that are low in salt (sodium). ? Do not add salt to your food. ? Check labels on foods and drinks to see much salt is in them. Look on the label where you see "Sodium." Lifestyle  Do not use any products that contain nicotine or tobacco, such as cigarettes and e-cigarettes. If you need help quitting, ask your doctor.  Do not use alcohol.  Avoid caffeine.  Avoid stress. Rest and get plenty of sleep. General instructions  Take over-the-counter and prescription medicines only as told by your doctor.  While lying down, lie on your left side. This keeps pressure off your baby.  While sitting or lying down, raise (elevate) your feet. Try putting some pillows under your lower legs.  Exercise regularly. Ask your doctor what kinds of exercise are best for you.  Keep all prenatal and follow-up visits as told by your doctor. This is important. Contact a doctor if:  You have symptoms that your doctor told you to watch for, such as: ? Fever. ? Throwing up (vomiting). ? Headache. Get help right away if:  You have very bad pain in your belly (abdomen).  You are throwing up, and this does not get better with treatment.  You suddenly get swelling in your hands, ankles, or face.  You gain 4 lb (1.8 kg) or more in 1 week.  You get bleeding from your vagina.  You have blood in your pee.  You do not feel your baby moving as much as normal.  You have a change in vision.  You have muscle twitching or sudden tightening (spasms).  You have trouble breathing.  Your lips  or fingernails turn blue. This information is not intended to replace advice given to you by your health care provider. Make sure you discuss any questions you have with your health care provider. Document Released: 11/06/2010 Document Revised: 06/15/2016 Document Reviewed: 06/15/2016 Elsevier Interactive Patient Education  2018 [employer].   Constipation, Adult Constipation is when a person has fewer bowel movements in a week than normal, has difficulty having a bowel movement, or has stools that are dry, hard, or larger than normal. Constipation may be caused by an underlying condition. It may become worse with age if a person takes certain medicines and does not take in enough fluids. Follow these instructions at home: Eating and drinking   Eat foods that have a lot of fiber, such as fresh fruits and vegetables, whole grains, and beans.  Limit foods that are high in fat, low in fiber, or overly processed, such as french fries, hamburgers, cookies, candies, and soda.  Drink enough fluid to keep your urine clear or pale yellow. General instructions  Exercise regularly or as told by your health care provider.  Go to the restroom when you have the urge to go. Do not hold it in.  Take over-the-counter and prescription medicines only as told by your health care provider. These include any fiber supplements.  Practice pelvic floor retraining exercises, such as deep breathing while relaxing the lower abdomen and pelvic floor relaxation during bowel movements.  Watch your condition for any changes.  Keep all follow-up visits as told by your health care provider. This is important. Contact a health care provider if:  You have pain that gets worse.  You have a fever.  You do not have a bowel movement after 4 days.  You vomit.  You are not hungry.  You lose weight.  You are bleeding from the anus.  You have thin, pencil-like stools. Get help right away if:  You have a fever  and your symptoms suddenly get worse.  You leak stool or have blood in your stool.  Your abdomen is bloated.  You have severe pain in your abdomen.  You feel dizzy or you faint. This information is not intended to replace advice given to you by your health care provider. Make sure you discuss any questions you have with your health care provider. Document Released: 07/02/2004 Document Revised: 04/23/2016 Document Reviewed: 03/24/2016 Elsevier Interactive Patient Education  2018 [employer]

## 2018-04-20 NOTE — MAU Note (Addendum)
Pt started leaking fluid this morning. Had a cough for 3 weeks.  Pt gets care in [place], said she is staying here until she has the baby.   Hx of asthma, not improving with inhaler.  Pt hasn't had BM in 1 week

## 2018-04-20 NOTE — MAU Provider Note (Addendum)
Chief Complaint  Patient presents with  . Contractions  . Rupture of Membranes  . Cough    First Provider Initiated Contact with Patient 04/20/18 1151     S: Carrie Herrera  is a 27 y.o. y.o. year old G52P1001 female at [redacted]w[redacted]d weeks gestation who presents to MAU with: 1. LLQ pain x a few days  - No BM x 7 days 2. Persistent cough and asthma Sx  - Hx Asthma. Seen in ED 04/14/18 for Asthma. Rx Oral steroids and albuterol inhaler. Minimal improvement. States she feels like when she had pneumonia in the past.  3. Incidental finding of elevated blood pressures.   - Pos Hx pre-E w/ last pregnancy. Current blood pressure medication: None.  Associated symptoms: Neg for Headache, vision changes, epigastric pain, nausea, vomiting, VB, contractions. Pos for chills, constipation, productive cough. Hasn't checked temp.  Questionable LOF vs Stress incontinence.  Fetal movement: Nml  Has had limited, sporadic prenatal care. [facility]NC in [place]. Has been seen in [facility] [place] [facility],   O:  Patient Vitals for the past 24 hrs:  BP Temp Pulse Resp SpO2  04/20/18 1225 - - - - 99 %  04/20/18 1220 - - - - 99 %  04/20/18 1147 - - - - 99 %  04/20/18 1146 138/68 - 88 - -  04/20/18 1131 (!) 142/69 - 85 - -  04/20/18 1125 - - - - 98 %  04/20/18 1121 137/62 - 96 - -  04/20/18 1120 - - - - 99 %  04/20/18 1115 - - - - 100 %  04/20/18 1103 128/76 - 99 - -  04/20/18 1055 - - - - 99 %  04/20/18 1054 (!) 136/98 - 96 - -  04/20/18 1050 (!) 142/119 - (!) 124 - -  04/20/18 1049 - 98.8 F (37.1 C) - 20 -   General: moderate distress.  Heart: Tachycardic initially than normal.  Regular rate and rhythm no murmurs rubs or gallops Lungs: Normal rate.  Mild increased work of breathing.  Rhonchi and wheezes present, right greater than left. Abd: Soft, positive left lower quadrant tenderness,?  Stool palpated., Gravid, S=D Extremities: Trace pedal edema Neuro: 2+ deep tendon reflexes, No clonus Pelvic: NEFG, no bleeding or pooling.    Dilation: Fingertip Effacement (%): Thick Exam by:: Ivonne Andrew CNM  EFM: 160, Moderate variability, 15 x 15 accelerations, no decelerations Toco: UI  Results for orders placed or performed during the hospital encounter of 04/20/18 (from the past 24 hour(s))  Protein / creatinine ratio, urine     Status: None   Collection Time: 04/20/18 10:57 AM  Result Value Ref Range   Creatinine, Urine 96.00 mg/dL   Total Protein, Urine 9 mg/dL   Protein Creatinine Ratio 0.09 0.00 - 0.15 mg/mg[Cre]  POCT fern test     Status: Abnormal   Collection Time: 04/20/18 10:58 AM  Result Value Ref Range   POCT Fern Test Negative = intact amniotic membranes   Urinalysis, Routine w reflex microscopic     Status: Abnormal   Collection Time: 04/20/18 11:15 AM  Result Value Ref Range   Color, Urine YELLOW YELLOW   APPearance HAZY (A) CLEAR   Specific Gravity, Urine 1.008 1.005 - 1.030   pH 8.0 5.0 - 8.0   Glucose, UA NEGATIVE NEGATIVE mg/dL   Hgb urine dipstick NEGATIVE NEGATIVE   Bilirubin Urine NEGATIVE NEGATIVE   Ketones, ur NEGATIVE NEGATIVE mg/dL   Protein, ur NEGATIVE NEGATIVE mg/dL   Nitrite NEGATIVE  NEGATIVE   Leukocytes, UA MODERATE (A) NEGATIVE   RBC / HPF 0-5 0 - 5 RBC/hpf   WBC, UA 11-20 0 - 5 WBC/hpf   Bacteria, UA RARE (A) NONE SEEN   Squamous Epithelial / LPF 11-20 0 - 5   Mucus PRESENT   Comprehensive metabolic panel     Status: Abnormal   Collection Time: 04/20/18 11:23 AM  Result Value Ref Range   Sodium 136 135 - 145 mmol/L   Potassium 3.3 (L) 3.5 - 5.1 mmol/L   Chloride 105 98 - 111 mmol/L   CO2 20 (L) 22 - 32 mmol/L   Glucose, Bld 90 70 - 99 mg/dL   BUN <5 (L) 6 - 20 mg/dL   Creatinine, Ser 1.320.52 0.44 - 1.00 mg/dL   Calcium 8.3 (L) 8.9 - 10.3 mg/dL   Total Protein 5.6 (L) 6.5 - 8.1 g/dL   Albumin 2.7 (L) 3.5 - 5.0 g/dL   AST 22 15 - 41 U/L   ALT 17 0 - 44 U/L   Alkaline Phosphatase 120 38 - 126 U/L   Total Bilirubin 0.2 (L) 0.3 - 1.2 mg/dL   GFR calc non Af Amer >60 >60  mL/min   GFR calc Af Amer >60 >60 mL/min   Anion gap 11 5 - 15  CBC     Status: Abnormal   Collection Time: 04/20/18 11:23 AM  Result Value Ref Range   WBC 12.4 (H) 4.0 - 10.5 K/uL   RBC 3.10 (L) 3.87 - 5.11 MIL/uL   Hemoglobin 9.6 (L) 12.0 - 15.0 g/dL   HCT 44.028.1 (L) 10.236.0 - 72.546.0 %   MCV 90.6 78.0 - 100.0 fL   MCH 31.0 26.0 - 34.0 pg   MCHC 34.2 30.0 - 36.0 g/dL   RDW 36.613.0 44.011.5 - 34.715.5 %   Platelets 346 150 - 400 K/uL  Wet prep, genital     Status: Abnormal   Collection Time: 04/20/18 12:11 PM  Result Value Ref Range   Yeast Wet Prep HPF POC NONE SEEN NONE SEEN   Trich, Wet Prep NONE SEEN NONE SEEN   Clue Cells Wet Prep HPF POC PRESENT (A) NONE SEEN   WBC, Wet Prep HPF POC MANY (A) NONE SEEN   Sperm NONE SEEN    Fern negative  MAU course Orders Placed This Encounter  Procedures  . Wet prep, genital  . DG Chest 2 View  . Comprehensive metabolic panel  . CBC  . Protein / creatinine ratio, urine  . Urinalysis, Routine w reflex microscopic  . Vital signs  . Check blood pressure 20 minutes after giving hydrALAZINE 10 mg IV dose. Call MD if SBP >/= 160 and/or DBP >/= 110.  . Once BP goal is reached, repeat BP every 10 minutes for 1 hour, then every 15 minutes for 1 hours, then per policy for antepartum labor or post-partum.  . Soap suds enema  . POCT fern test  . Insert peripheral IV  . Discharge patient   Meds ordered this encounter  Medications  . labetalol (NORMODYNE,TRANDATE) injection 20-80 mg  . hydrALAZINE (APRESOLINE) injection 10 mg  . lactated ringers bolus 1,000 mL  . albuterol (PROVENTIL) (2.5 MG/3ML) 0.083% nebulizer solution 2.5 mg  . DISCONTD: mometasone-formoterol (DULERA) 200-5 MCG/ACT inhaler 2 puff  . mometasone-formoterol (DULERA) 200-5 MCG/ACT inhaler 2 puff  . mometasone-formoterol (DULERA) 200-5 MCG/ACT AERO    Sig: Inhale 2 puffs into the lungs 2 (two) times daily.    Dispense:  8.8  g    Refill:  1    Order Specific Question:   Supervising  Provider    Answer:   CONSTANT, PEGGY [[id number]]   Partial improvement of asthma symptoms with albuterol nebulizer.  Wheezing improved significantly.  Scattered rhonchi present.  Will order chest x-ray.  Discussed Hx, labs, exam w/ Dr. Donnella Bi. Agrees w/ POC for enema and inhaled steroid.  Dulera ordered.  Feeling much better after enema.  Abdominal pain resolved.  MDM - Hypertension in pregnancy without evidence of preeclampsia.  Precautions reviewed.  Will get growth ultrasound and start antenatal testing. - Left lower quadrant pain secondary to constipation.  Resolved with enema.  Discussed increasing fluids and fiber.  MiraLAX as needed for constipation prevention. - Persistent moderate asthma.  No evidence of pulmonary edema, pneumonia or status asthmaticus.  Low suspicion for PE due to absence of tachycardia, normal oxygen saturations, no evidence of DVT and symptoms reasonably explained by asthma.  Started patient on [place].  Instructed her to use 2 puffs twice daily on schedule for now and albuterol as needed for rescue.  - No evidence of ruptured membranes - Limited prenatal care.  [facility] basket [facility] to transfer care and start antenatal testing.  A: [redacted]w[redacted]d week IUP FHR reactive 1. Moderate persistent asthma with acute exacerbation   2. Cough   3. Rhonchi   4. Gestational hypertension without significant proteinuria in third trimester   5. Constipation during pregnancy in third trimester   6. No leakage of amniotic fluid into vagina    P: Discharge home in stable condition per consult with Constant, Peggy, MD. Preeclampsia precautions. Follow-up for blood pressure check in 4 days at [facility] sooner as needed if symptoms worsen. Return to maternity admissions as needed in emergencies  [place], [place], [place] 04/20/2018 3:15 PM

## 2018-04-21 ENCOUNTER — Telehealth: Payer: Self-pay | Admitting: Family Medicine

## 2018-04-21 LAB — GC/CHLAMYDIA PROBE AMP (~~LOC~~) NOT AT ARMC
Chlamydia: NEGATIVE
Neisseria Gonorrhea: NEGATIVE

## 2018-04-21 NOTE — Telephone Encounter (Signed)
Called patient to inform her of her up coming appointment. Phone not working.

## 2018-04-24 ENCOUNTER — Encounter: Payer: Medicaid - Out of State | Admitting: Family Medicine

## 2018-04-24 ENCOUNTER — Telehealth: Payer: Self-pay | Admitting: *Deleted

## 2018-04-24 ENCOUNTER — Other Ambulatory Visit: Payer: Medicaid - Out of State

## 2018-04-24 NOTE — Telephone Encounter (Signed)
[facility]. I called Carrie Herrera and notified her she missed her appointments. She states she was not notified by her doctor's office that referred her about her appointments.  I informed her I will have registrars reschedule and call her with new appointments for asap and that we highly encourage her to keep the appointments. She states she feels her baby moving well and isn't have headaches. Has a cough.

## 2018-04-25 ENCOUNTER — Ambulatory Visit (INDEPENDENT_AMBULATORY_CARE_PROVIDER_SITE_OTHER): Payer: Medicaid Other | Admitting: *Deleted

## 2018-04-25 ENCOUNTER — Ambulatory Visit: Payer: Self-pay

## 2018-04-25 ENCOUNTER — Ambulatory Visit (HOSPITAL_COMMUNITY)
Admission: RE | Admit: 2018-04-25 | Discharge: 2018-04-25 | Disposition: A | Discharge status: Home / Self Care | Payer: Medicaid - Out of State | Source: Ambulatory Visit | Attending: Obstetrics and Gynecology | Admitting: Obstetrics and Gynecology

## 2018-04-25 VITALS — BP 129/68 | HR 89 | Wt 184.0 lb

## 2018-04-25 DIAGNOSIS — Z363 Encounter for antenatal screening for malformations: Secondary | ICD-10-CM

## 2018-04-25 DIAGNOSIS — Z3A35 35 weeks gestation of pregnancy: Secondary | ICD-10-CM

## 2018-04-25 DIAGNOSIS — O133 Gestational [pregnancy-induced] hypertension without significant proteinuria, third trimester: Secondary | ICD-10-CM

## 2018-04-25 NOTE — Progress Notes (Signed)
Pt informed that the ultrasound is considered a limited OB ultrasound and is not intended to be a complete ultrasound exam.  Patient also informed that the ultrasound is not being completed with the intent of assessing for fetal or placental anomalies or any pelvic abnormalities.  Explained that the purpose of today's ultrasound is to assess for presentation, BPP and amniotic fluid volume.  Patient acknowledges the purpose of the exam and the limitations of the study.    Pt denies H/A or visual disturbances. She feels her BP was elevated on 7/4 during MAU visit because she was in pain, constipated and she was having excessive coughing and vomiting. US @ MFM today for growth, anatomy. Pt history and reason for visit discussed with Dr. Adrian BlackwaterStinson. [facility]. New Ob appt will be scheduled and prenatal records requested from Ob in [place].

## 2018-04-28 ENCOUNTER — Encounter: Payer: Medicaid - Out of State | Admitting: Obstetrics and Gynecology

## 2018-05-05 ENCOUNTER — Ambulatory Visit (INDEPENDENT_AMBULATORY_CARE_PROVIDER_SITE_OTHER): Payer: Medicaid Other | Admitting: Obstetrics and Gynecology

## 2018-05-05 ENCOUNTER — Encounter (HOSPITAL_COMMUNITY): Payer: Self-pay | Admitting: *Deleted

## 2018-05-05 ENCOUNTER — Encounter: Payer: Self-pay | Admitting: Obstetrics and Gynecology

## 2018-05-05 ENCOUNTER — Other Ambulatory Visit: Payer: Self-pay

## 2018-05-05 ENCOUNTER — Inpatient Hospital Stay (HOSPITAL_COMMUNITY)
Admission: AD | Admit: 2018-05-05 | Discharge: 2018-05-05 | Disposition: A | Discharge status: Home / Self Care | Payer: Medicaid - Out of State | Source: Ambulatory Visit | Attending: Obstetrics & Gynecology | Admitting: Obstetrics & Gynecology

## 2018-05-05 VITALS — BP 142/76 | HR 97 | Wt 184.0 lb

## 2018-05-05 DIAGNOSIS — Z87891 Personal history of nicotine dependence: Secondary | ICD-10-CM | POA: Diagnosis not present

## 2018-05-05 DIAGNOSIS — O0930 Supervision of pregnancy with insufficient antenatal care, unspecified trimester: Secondary | ICD-10-CM

## 2018-05-05 DIAGNOSIS — O99412 Diseases of the circulatory system complicating pregnancy, second trimester: Secondary | ICD-10-CM

## 2018-05-05 DIAGNOSIS — O133 Gestational [pregnancy-induced] hypertension without significant proteinuria, third trimester: Secondary | ICD-10-CM

## 2018-05-05 DIAGNOSIS — O163 Unspecified maternal hypertension, third trimester: Secondary | ICD-10-CM | POA: Diagnosis not present

## 2018-05-05 DIAGNOSIS — O09899 Supervision of other high risk pregnancies, unspecified trimester: Secondary | ICD-10-CM

## 2018-05-05 DIAGNOSIS — Z833 Family history of diabetes mellitus: Secondary | ICD-10-CM | POA: Diagnosis not present

## 2018-05-05 DIAGNOSIS — I340 Nonrheumatic mitral (valve) insufficiency: Secondary | ICD-10-CM

## 2018-05-05 DIAGNOSIS — Z3A37 37 weeks gestation of pregnancy: Secondary | ICD-10-CM | POA: Diagnosis not present

## 2018-05-05 DIAGNOSIS — O0993 Supervision of high risk pregnancy, unspecified, third trimester: Secondary | ICD-10-CM

## 2018-05-05 DIAGNOSIS — O169 Unspecified maternal hypertension, unspecified trimester: Secondary | ICD-10-CM | POA: Diagnosis present

## 2018-05-05 DIAGNOSIS — Z23 Encounter for immunization: Secondary | ICD-10-CM

## 2018-05-05 DIAGNOSIS — Z0371 Encounter for suspected problem with amniotic cavity and membrane ruled out: Secondary | ICD-10-CM

## 2018-05-05 DIAGNOSIS — Z8249 Family history of ischemic heart disease and other diseases of the circulatory system: Secondary | ICD-10-CM | POA: Diagnosis not present

## 2018-05-05 LAB — COMPREHENSIVE METABOLIC PANEL
ALT: 18 U/L (ref 0–44)
AST: 19 U/L (ref 15–41)
Albumin: 2.7 g/dL — ABNORMAL LOW (ref 3.5–5.0)
Alkaline Phosphatase: 141 U/L — ABNORMAL HIGH (ref 38–126)
Anion gap: 7 (ref 5–15)
BILIRUBIN TOTAL: 0.3 mg/dL (ref 0.3–1.2)
CO2: 23 mmol/L (ref 22–32)
Calcium: 8.5 mg/dL — ABNORMAL LOW (ref 8.9–10.3)
Chloride: 107 mmol/L (ref 98–111)
Creatinine, Ser: 0.49 mg/dL (ref 0.44–1.00)
GFR calc Af Amer: >60 mL/min (ref >60)
GLUCOSE: 72 mg/dL (ref 70–99)
Potassium: 4.1 mmol/L (ref 3.5–5.1)
Sodium: 137 mmol/L (ref 135–145)
TOTAL PROTEIN: 6.1 g/dL — AB (ref 6.5–8.1)

## 2018-05-05 LAB — OB RESULTS CONSOLE GBS: GBS: POSITIVE

## 2018-05-05 LAB — CBC
HEMATOCRIT: 30.5 % — AB (ref 36.0–46.0)
Hemoglobin: 10.2 g/dL — ABNORMAL LOW (ref 12.0–15.0)
MCH: 30.6 pg (ref 26.0–34.0)
MCHC: 33.4 g/dL (ref 30.0–36.0)
MCV: 91.6 fL (ref 78.0–100.0)
Platelets: 346 10*3/uL (ref 150–400)
RBC: 3.33 MIL/uL — AB (ref 3.87–5.11)
RDW: 13.5 % (ref 11.5–15.5)
WBC: 6.8 10*3/uL (ref 4.0–10.5)

## 2018-05-05 LAB — POCT URINALYSIS DIP (DEVICE)
BILIRUBIN URINE: NEGATIVE
GLUCOSE, UA: NEGATIVE mg/dL
Hgb urine dipstick: NEGATIVE
Ketones, ur: NEGATIVE mg/dL
Nitrite: NEGATIVE
Protein, ur: 30 mg/dL — AB
Specific Gravity, Urine: 1.015 (ref 1.005–1.030)
Urobilinogen, UA: 0.2 mg/dL (ref 0.0–1.0)
pH: 7 (ref 5.0–8.0)

## 2018-05-05 LAB — PROTEIN / CREATININE RATIO, URINE
CREATININE, URINE: 202 mg/dL
Protein Creatinine Ratio: 0.09 mg/mg{Cre} (ref 0.00–0.15)
Total Protein, Urine: 18 mg/dL

## 2018-05-05 NOTE — MAU Note (Signed)
First OB appt today, BP was up, sent to MAU for further eval. Denies HA, visual changes, epigastric pain or increased swelling.  Denies bleeding or leaking.

## 2018-05-05 NOTE — MAU Note (Signed)
Urine in lab 

## 2018-05-05 NOTE — Progress Notes (Signed)
INITIAL PRENATAL VISIT NOTE  Subjective:  Carrie Herrera is a 27 y.o. G2P1001 at 3716w1d by LMP being seen today for her initial prenatal visit. She was getting care in [place] and reports several visits there, reports no issues with pregnancy. She has an obstetric history significant for 1 xSVD. She has a medical history significant for asthma and mitral valve regurge. She reports asthma fairly well controlled, never been intubated. Takes Dulera BID and ventolin prn.  Had elevated BP in hospital, she thinks this was due to her cough and cold.  Patient reports occasional contractions.  Contractions: Irritability. Vag. Bleeding: None.  Movement: Present. Denies leaking of fluid. She has had intermittent mild headache recently, not normal for her and comes and goes. None today. Denies nausea/vomiting. Does have back pain but thinks its from coughing.  Past Medical History:  Diagnosis Date  . Asthma   . Chlamydia   . Hypertension    during pregnancy   . Maternal mitral valve regurgitation affecting pregnancy in second trimester, antepartum 04/20/2018   Echo 03/26/18: Mild to moderate mitral valve regurgitation, mild tricuspid regurgitation      . Seizures (HCC)    Past Surgical History:  Procedure Laterality Date  . NO PAST SURGERIES     OB History  Gravida Para Term Preterm AB Living  2 1 1  0 0 1  SAB TAB Ectopic Multiple Live Births  0 0 0 0 1    # Outcome Date GA Lbr Len/2nd Weight Sex Delivery Anes PTL Lv  2 Current           1 Term 01/30/15 7917w0d  5 lb 7.5 oz (2.481 kg) F Vag-Spont  N LIV    Social History   Socioeconomic History  . Marital status: Single    Spouse name: Not on file  . Number of children: Not on file  . Years of education: Not on file  . Highest education level: Not on file  Occupational History  . Not on file  Social Needs  . Financial resource strain: Not on file  . Food insecurity:    Worry: Not on file    Inability: Not on file  . Transportation  needs:    Medical: Not on file    Non-medical: Not on file  Tobacco Use  . Smoking status: Former Smoker    Packs/day: 0.50  . Smokeless tobacco: Never Used  Substance and Sexual Activity  . Alcohol use: Yes  . Drug use: Yes    Types: Marijuana    Comment: quit earlier in preg  . Sexual activity: Yes  Lifestyle  . Physical activity:    Days per week: Not on file    Minutes per session: Not on file  . Stress: Not on file  Relationships  . Social connections:    Talks on phone: Not on file    Gets together: Not on file    Attends religious service: Not on file    Active member of club or organization: Not on file    Attends meetings of clubs or organizations: Not on file    Relationship status: Not on file  Other Topics Concern  . Not on file  Social History Narrative  . Not on file   Family History  Problem Relation Age of Onset  . Diabetes Maternal Grandmother   . Heart disease Maternal Grandmother     (Not in a hospital admission)  No Known Allergies  Review of Systems: Negative except for  what is mentioned in HPI.  Objective:   Vitals:   05/05/18 1111  BP: (!) 142/76  Pulse: 97  Weight: 184 lb (83.5 kg)   Fetal Status: Fetal Heart Rate (bpm): 144   Movement: Present     Physical Exam: BP (!) 142/76   Pulse 97   Wt 184 lb (83.5 kg)   LMP 05/14/2017   BMI 34.77 kg/m  CONSTITUTIONAL: Well-developed, well-nourished female in no acute distress.  NEUROLOGIC: Alert and oriented to person, place, and time. Normal reflexes, muscle tone coordination. No cranial nerve deficit noted. PSYCHIATRIC: Normal mood and affect. Normal behavior. Normal judgment and thought content. SKIN: Skin is warm and dry. No rash noted. Not diaphoretic. No erythema. No pallor. HENT:  Normocephalic, atraumatic, External right and left ear normal. Oropharynx is clear and moist EYES: Conjunctivae and EOM are normal. Pupils are equal, round, and reactive to light. No scleral icterus.    NECK: Normal range of motion, supple, no masses CARDIOVASCULAR: Normal heart rate noted, regular rhythm RESPIRATORY: Effort and breath sounds normal, no problems with respiration noted ABDOMEN: Soft, nontender, nondistended, gravid. GU: normal appearing external female genitalia, multiparous normal appearing cervix, copious white discharge in vagina, no lesions noted MUSCULOSKELETAL: Normal range of motion. EXT:  No edema and no tenderness. 2+ distal pulses.  Assessment and Plan:  Pregnancy: G2P1001 at [redacted]w[redacted]d by LMP. Transfer of care today from practice in [place], no records available, has not been seen in 3 months.   1. Supervision of high risk pregnancy in third trimester Reports normal labs - Culture, beta strep (group b only)  2. Gestational hypertension without significant proteinuria in third trimester Meets criteria for gestational HTN today (from visit in MAU 7/4) To MAU for PEC/GHTN eval  3. Maternal mitral valve regurgitation affecting pregnancy in second trimester, antepartum Does not know anything about this, denies ever having had echo  4. Limited prenatal care, antepartum Transfer of care today from practice in [place], has not been seen in 3 months.   5. Supervision of other high risk pregnancy, antepartum Late transfer, needs labs done   Term labor symptoms and general obstetric precautions including but not limited to vaginal bleeding, contractions, leaking of fluid and fetal movement were reviewed in detail with the patient.  Please refer to After Visit Summary for other counseling recommendations.   Return in about 1 week (around 05/12/2018) for OB visit.  Conan Bowens 05/05/2018 11:40 AM

## 2018-05-05 NOTE — Progress Notes (Signed)
Patient reports headaches, denies dizziness or blurry vision

## 2018-05-05 NOTE — MAU Provider Note (Addendum)
Chief Complaint:  Hypertension   None     HPI: Carrie Herrera is a 27 y.o. G2P1001 at 7720w1d who [facility] matern[facility]. She had elevated BP, 140s/90s in MAU visit on 04/20/18 when she had URI with cough and presented today for her first prenatal appt and had borderline blood pressures again. She denies any h/a, epigastric pain, or visual disturbances.  There are no other symptoms. She has not tried any treatments. She reports good fetal movement, denies LOF, vaginal bleeding, vaginal itching/burning, urinary symptoms, h/a, dizziness, n/v, or fever/chills.    HPI  Past Medical History: Past Medical History:  Diagnosis Date  . Asthma   . Chlamydia   . Hypertension    during pregnancy   . Maternal mitral valve regurgitation affecting pregnancy in second trimester, antepartum 04/20/2018   Echo 03/26/18: Mild to moderate mitral valve regurgitation, mild tricuspid regurgitation      . Seizures (HCC)     Past obstetric history: OB History  Gravida Para Term Preterm AB Living  2 1 1  0 0 1  SAB TAB Ectopic Multiple Live Births  0 0 0 0 1    # Outcome Date GA Lbr Len/2nd Weight Sex Delivery Anes PTL Lv  2 Current           1 Term 01/30/15 9160w0d  2.481 kg (5 lb 7.5 oz) F Vag-Spont  N LIV    Past Surgical History: Past Surgical History:  Procedure Laterality Date  . NO PAST SURGERIES      Family History: Family History  Problem Relation Age of Onset  . Diabetes Maternal Grandmother   . Heart disease Maternal Grandmother     Social History: Social History   Tobacco Use  . Smoking status: Former Smoker    Packs/day: 0.50  . Smokeless tobacco: Never Used  Substance Use Topics  . Alcohol use: Yes  . Drug use: Yes    Types: Marijuana    Comment: quit earlier in preg    Allergies: No Known Allergies  Meds:  No medications prior to admission.    ROS:  Review of Systems  Constitutional: Negative for chills, fatigue and fever.  Eyes:  Negative for visual disturbance.  Respiratory: Negative for shortness of breath.   Cardiovascular: Negative for chest pain.  Gastrointestinal: Negative for abdominal pain, nausea and vomiting.  Genitourinary: Negative for difficulty urinating, dysuria, flank pain, pelvic pain, vaginal bleeding, vaginal discharge and vaginal pain.  Neurological: Negative for dizziness and headaches.  Psychiatric/Behavioral: Negative.      I have reviewed patient's Past Medical Hx, Surgical Hx, Family Hx, Social Hx, medications and allergies.   Physical Exam   Patient Vitals for the past 24 hrs:  BP Temp Temp src Pulse Resp SpO2 Weight  05/05/18 1504 122/69 - - 76 - - -  05/05/18 1246 130/70 - - 89 - - -  05/05/18 1241 137/72 - - 87 - - -  05/05/18 1219 137/72 99 F (37.2 C) Oral 92 18 100 % 83.6 kg (184 lb 4 oz)   Constitutional: Well-developed, well-nourished female in no acute distress.  Cardiovascular: normal rate Respiratory: normal effort GI: Abd soft, non-tender, gravid appropriate for gestational age.  MS: Extremities nontender, no edema, normal ROM Neurologic: Alert and oriented x 4.  GU: Neg CVAT.  PELVIC EXAM: Deferred     FHT:  Baseline 150 , moderate variability, accelerations present, no decelerations Contractions: None on toco or to palpation  Labs: Results for orders placed or performed during the hospital encounter of 05/05/18 (from the past 24 hour(s))  Protein / creatinine ratio, urine     Status: None   Collection Time: 05/05/18 12:45 PM  Result Value Ref Range   Creatinine, Urine 202.00 mg/dL   Total Protein, Urine 18 mg/dL   Protein Creatinine Ratio 0.09 0.00 - 0.15 mg/mg[Cre]  CBC     Status: Abnormal   Collection Time: 05/05/18  2:18 PM  Result Value Ref Range   WBC 6.8 4.0 - 10.5 K/uL   RBC 3.33 (L) 3.87 - 5.11 MIL/uL   Hemoglobin 10.2 (L) 12.0 - 15.0 g/dL   HCT 64.4 (L) 03.4 - 74.2 %   MCV 91.6 78.0 - 100.0 fL   MCH 30.6 26.0 - 34.0 pg   MCHC 33.4 30.0 -  36.0 g/dL   RDW 59.5 63.8 - 75.6 %   Platelets 346 150 - 400 K/uL  Comprehensive metabolic panel     Status: Abnormal   Collection Time: 05/05/18  2:18 PM  Result Value Ref Range   Sodium 137 135 - 145 mmol/L   Potassium 4.1 3.5 - 5.1 mmol/L   Chloride 107 98 - 111 mmol/L   CO2 23 22 - 32 mmol/L   Glucose, Bld 72 70 - 99 mg/dL   BUN <5 (L) 6 - 20 mg/dL   Creatinine, Ser 4.33 0.44 - 1.00 mg/dL   Calcium 8.5 (L) 8.9 - 10.3 mg/dL   Total Protein 6.1 (L) 6.5 - 8.1 g/dL   Albumin 2.7 (L) 3.5 - 5.0 g/dL   AST 19 15 - 41 U/L   ALT 18 0 - 44 U/L   Alkaline Phosphatase 141 (H) 38 - 126 U/L   Total Bilirubin 0.3 0.3 - 1.2 mg/dL   GFR calc non Af Amer >60 >60 mL/min   GFR calc Af Amer >60 >60 mL/min   Anion gap 7 5 - 15    Report to [name], CNM        Sharen Counter Certified Nurse-Midwife 05/05/2018 1:38 PM  *Consult with Dr. Debroah Loop @ 1440 - notified of patient's complaints, assessments, lab & U/S results, tx plan d/c home with BP check on Monday 05/08/18 - ok to d/c home, agrees with plan  Gestational hypertension without significant proteinuria in third trimester - Plan: Discharge patient - Discussed plan of care with patient -  Follow-up Information    [facility] Go on 05/08/2018.   Specialty:  Obstetrics and Gynecology Why:  Someone from the office will call you to schedule a blood pressure check on Monday 05/08/2018. Contact information: [address] [place] [address] [phone]       - Information provided on HTN in pregnancy - Patient verbalized an understanding of the plan of care and agrees.    [name], CNM  05/05/2018 6:35 PM

## 2018-05-08 ENCOUNTER — Encounter (HOSPITAL_COMMUNITY): Payer: Self-pay

## 2018-05-08 ENCOUNTER — Inpatient Hospital Stay (HOSPITAL_COMMUNITY)
Admission: AD | Admit: 2018-05-08 | Discharge: 2018-05-08 | Disposition: A | Discharge status: Home / Self Care | Payer: Medicaid - Out of State | Source: Ambulatory Visit | Attending: Obstetrics and Gynecology | Admitting: Obstetrics and Gynecology

## 2018-05-08 ENCOUNTER — Other Ambulatory Visit: Payer: Self-pay

## 2018-05-08 ENCOUNTER — Telehealth: Payer: Self-pay | Admitting: Obstetrics and Gynecology

## 2018-05-08 DIAGNOSIS — Z87891 Personal history of nicotine dependence: Secondary | ICD-10-CM | POA: Diagnosis not present

## 2018-05-08 DIAGNOSIS — O99323 Drug use complicating pregnancy, third trimester: Secondary | ICD-10-CM

## 2018-05-08 DIAGNOSIS — R109 Unspecified abdominal pain: Secondary | ICD-10-CM

## 2018-05-08 DIAGNOSIS — W182XXA Fall in (into) shower or empty bathtub, initial encounter: Secondary | ICD-10-CM | POA: Diagnosis not present

## 2018-05-08 DIAGNOSIS — Z3689 Encounter for other specified antenatal screening: Secondary | ICD-10-CM

## 2018-05-08 DIAGNOSIS — W19XXXA Unspecified fall, initial encounter: Secondary | ICD-10-CM

## 2018-05-08 DIAGNOSIS — O26893 Other specified pregnancy related conditions, third trimester: Secondary | ICD-10-CM | POA: Insufficient documentation

## 2018-05-08 DIAGNOSIS — Y92009 Unspecified place in unspecified non-institutional (private) residence as the place of occurrence of the external cause: Secondary | ICD-10-CM

## 2018-05-08 DIAGNOSIS — Z3A37 37 weeks gestation of pregnancy: Secondary | ICD-10-CM | POA: Diagnosis not present

## 2018-05-08 LAB — URINALYSIS, ROUTINE W REFLEX MICROSCOPIC
BILIRUBIN URINE: NEGATIVE
Glucose, UA: NEGATIVE mg/dL
HGB URINE DIPSTICK: NEGATIVE
KETONES UR: NEGATIVE mg/dL
Nitrite: NEGATIVE
Protein, ur: NEGATIVE mg/dL
Specific Gravity, Urine: 1.011 (ref 1.005–1.030)
pH: 7 (ref 5.0–8.0)

## 2018-05-08 LAB — WET PREP, GENITAL
Sperm: NONE SEEN
TRICH WET PREP: NONE SEEN
YEAST WET PREP: NONE SEEN

## 2018-05-08 LAB — RAPID URINE DRUG SCREEN, HOSP PERFORMED
AMPHETAMINES: NOT DETECTED
BENZODIAZEPINES: NOT DETECTED
Barbiturates: NOT DETECTED
Cocaine: NOT DETECTED
Opiates: NOT DETECTED
TETRAHYDROCANNABINOL: POSITIVE — AB

## 2018-05-08 LAB — CULTURE, BETA STREP (GROUP B ONLY): Strep Gp B Culture: POSITIVE — AB

## 2018-05-08 MED ORDER — ACETAMINOPHEN 500 MG PO TABS
1000.0000 mg | ORAL_TABLET | Freq: Once | ORAL | Status: AC
  Administered 2018-05-08: 1000 mg via ORAL
  Filled 2018-05-08: qty 2

## 2018-05-08 MED ORDER — METRONIDAZOLE 500 MG PO TABS: 500.0000 mg | ORAL_TABLET | Freq: Two times a day (BID) | ORAL | 0 refills | Status: DC

## 2018-05-08 NOTE — MAU Note (Addendum)
Pt presents to MAU with c/o left sided abdominal pain that began after falling last night, pt also states that it hurts to take breaths. Pt has also had headache since last night. Pt denies VB and LOF. +FM

## 2018-05-08 NOTE — Discharge Instructions (Signed)

## 2018-05-08 NOTE — MAU Provider Note (Addendum)
History     CSN: [id number]  Arrival date and time: 05/08/18 1053   First Provider Initiated Contact with Patient 05/08/18 1128      Chief Complaint  Patient presents with  . Abdominal Pain   HPI  Carrie Herrera is a 27 y.o. G2P1001 at 7168w4d who presents to MAU with chief report of abdominal pain in the setting of a fall last night at midnight. Patient reports she was in the shower, slipped on the tile, and fell landing on her back. Was able to sleep through the night but woke up late this morning with pain 7-9/10, across her entire abdomen. Pain does not radiate, patient unable to state aggravating or alleviating factors. Has not eaten or taken medication today.   Denies headache, visual disturbances, vaginal bleeding, leaking of fluid, decreased fetal movement, fever, or recent illness.  Denies sexual intercourse.  Pregnancy is complicated by Gestational hypertension without significant proteinuria in third trimester, moderate persistent asthma with acute exacerbation; Anemia in pregnancy; Supervision of other high risk pregnancy, antepartum; Rubella non-immune status, antepartum; Limited prenatal care, antepartum; No leakage of amniotic fluid into vagina; Maternal mitral valve regurgitation affecting pregnancy in second trimester, antepartum; Housing situation unstable; Marijuana use; and Hypertension.  OB History    Gravida  2   Para  1   Term  1   Preterm  0   AB  0   Living  1     SAB  0   TAB  0   Ectopic  0   Multiple  0   Live Births  1           Past Medical History:  Diagnosis Date  . Asthma   . Chlamydia   . Hypertension    during pregnancy   . Maternal mitral valve regurgitation affecting pregnancy in second trimester, antepartum 04/20/2018   Echo 03/26/18: Mild to moderate mitral valve regurgitation, mild tricuspid regurgitation      . Seizures (HCC)     Past Surgical History:  Procedure Laterality Date  . NO PAST SURGERIES      Family  History  Problem Relation Age of Onset  . Diabetes Maternal Grandmother   . Heart disease Maternal Grandmother     Social History   Tobacco Use  . Smoking status: Former Smoker    Packs/day: 0.50  . Smokeless tobacco: Never Used  Substance Use Topics  . Alcohol use: Yes  . Drug use: Yes    Types: Marijuana    Comment: quit earlier in preg    Allergies: No Known Allergies  Medications Prior to Admission  Medication Sig Dispense Refill Last Dose  . albuterol (PROVENTIL HFA;VENTOLIN HFA) 108 (90 Base) MCG/ACT inhaler Inhale 2 puffs into the lungs every 6 (six) hours as needed for wheezing or shortness of breath.   rescue  . ferrous sulfate 325 (65 FE) MG tablet Take 325 mg by mouth daily with breakfast.   05/05/2018 at Unknown time  . mometasone-formoterol (DULERA) 200-5 MCG/ACT AERO Inhale 2 puffs into the lungs 2 (two) times daily. 8.8 g 1 05/05/2018 at Unknown time  . Omega-3 Fatty Acids (FISH OIL) 1000 MG CAPS Take 2 capsules by mouth daily.    Past Week at Unknown time  . Prenatal MV-Min-FA-Omega-3 (PRENATAL GUMMIES/DHA & FA) 0.4-32.5 MG CHEW Chew 2 tablets by mouth daily.   05/05/2018 at Unknown time    Review of Systems  Constitutional: Negative for chills, fatigue and fever.  Genitourinary: Positive for vaginal  discharge. Negative for vaginal bleeding and vaginal pain.  All other systems reviewed and are negative.  Physical Exam   Blood pressure (!) 148/76, pulse 90, temperature 98.7 F (37.1 C), temperature source Oral, resp. rate 18, last menstrual period 05/14/2017.  Physical Exam  Nursing note and vitals reviewed. Constitutional: She is oriented to person, place, and time. She appears well-developed and well-nourished.  Cardiovascular: Normal rate, normal heart sounds and intact distal pulses.  Respiratory: Effort normal and breath sounds normal.  GI: There is no tenderness. There is no rebound and no guarding.  Gravid  Genitourinary: Vagina normal and uterus  normal. Cervix exhibits no motion tenderness. No vaginal discharge found.  Musculoskeletal: Normal range of motion.  Neurological: She is alert and oriented to person, place, and time. She has normal reflexes.  Skin: Skin is warm and dry.  Psychiatric: She has a normal mood and affect. Her behavior is normal. Judgment and thought content normal.    MAU Course  Procedures  MDM --Category 1 throughout four hours of monitoring s/p fall at midnight last night/this morning --+ THC in pregnancy --Cervix closed/thick/posterior at beginning and end of observation  Patient Vitals for the past 24 hrs:  BP Temp Temp src Pulse Resp  05/08/18 1540 139/84 - - - -  05/08/18 1339 (!) 117/49 - - 74 -  05/08/18 1145 134/79 - - 84 -  05/08/18 1130 140/90 - - 85 -  05/08/18 1115 (!) 148/76 - - 90 -  05/08/18 1112 (!) 141/99 98.7 F (37.1 C) Oral 88 18   Orders Placed This Encounter  Procedures  . Wet prep, genital  . Culture, OB Urine  . Urinalysis, Routine w reflex microscopic  . Urine rapid drug screen (hosp performed)  . Discharge patient   Results for orders placed or performed during the hospital encounter of 05/08/18 (from the past 24 hour(s))  Urinalysis, Routine w reflex microscopic     Status: Abnormal   Collection Time: 05/08/18 11:30 AM  Result Value Ref Range   Color, Urine YELLOW YELLOW   APPearance HAZY (A) CLEAR   Specific Gravity, Urine 1.011 1.005 - 1.030   pH 7.0 5.0 - 8.0   Glucose, UA NEGATIVE NEGATIVE mg/dL   Hgb urine dipstick NEGATIVE NEGATIVE   Bilirubin Urine NEGATIVE NEGATIVE   Ketones, ur NEGATIVE NEGATIVE mg/dL   Protein, ur NEGATIVE NEGATIVE mg/dL   Nitrite NEGATIVE NEGATIVE   Leukocytes, UA MODERATE (A) NEGATIVE   RBC / HPF 6-10 0 - 5 RBC/hpf   WBC, UA 11-20 0 - 5 WBC/hpf   Bacteria, UA RARE (A) NONE SEEN   Squamous Epithelial / LPF 6-10 0 - 5   Mucus PRESENT   Urine rapid drug screen (hosp performed)     Status: Abnormal   Collection Time: 05/08/18  11:37 AM  Result Value Ref Range   Opiates NONE DETECTED NONE DETECTED   Cocaine NONE DETECTED NONE DETECTED   Benzodiazepines NONE DETECTED NONE DETECTED   Amphetamines NONE DETECTED NONE DETECTED   Tetrahydrocannabinol POSITIVE (A) NONE DETECTED   Barbiturates NONE DETECTED NONE DETECTED  Wet prep, genital     Status: Abnormal   Collection Time: 05/08/18 11:38 AM  Result Value Ref Range   Yeast Wet Prep HPF POC NONE SEEN NONE SEEN   Trich, Wet Prep NONE SEEN NONE SEEN   Clue Cells Wet Prep HPF POC PRESENT (A) NONE SEEN   WBC, Wet Prep HPF POC FEW (A) NONE SEEN   Sperm NONE  SEEN    Assessment and Plan  --27 y.o. G2P1001 at [redacted]w[redacted]d s/p fall at midnight last night --Category 1 throughout 4 hours of monitoring --Cervix closed at end of period of observation --Not laboring, rare contractions on monitor --Clue cells on wet prep, paper rx Flagyl given to patient --Denies pain or physical complaints at time of discharge --D/C home in stable condition  Next OB appt 05/11/2018 at [facility]  Calvert Cantor, CNM 05/08/2018, 3:48 PM

## 2018-05-08 NOTE — Telephone Encounter (Signed)
Called patient to make her an appointment for her BP check on 07/22. Had to leave a voicemail message to give us a call back.

## 2018-05-09 LAB — CULTURE, OB URINE

## 2018-05-09 LAB — GC/CHLAMYDIA PROBE AMP (~~LOC~~) NOT AT ARMC
Chlamydia: NEGATIVE
Neisseria Gonorrhea: NEGATIVE

## 2018-05-11 ENCOUNTER — Ambulatory Visit (INDEPENDENT_AMBULATORY_CARE_PROVIDER_SITE_OTHER): Payer: Medicaid - Out of State | Admitting: Obstetrics & Gynecology

## 2018-05-11 ENCOUNTER — Encounter (HOSPITAL_COMMUNITY): Payer: Self-pay | Admitting: *Deleted

## 2018-05-11 ENCOUNTER — Telehealth (HOSPITAL_COMMUNITY): Payer: Self-pay | Admitting: *Deleted

## 2018-05-11 ENCOUNTER — Ambulatory Visit: Payer: Self-pay

## 2018-05-11 VITALS — BP 142/80 | HR 80 | Wt 186.9 lb

## 2018-05-11 DIAGNOSIS — O133 Gestational [pregnancy-induced] hypertension without significant proteinuria, third trimester: Secondary | ICD-10-CM

## 2018-05-11 DIAGNOSIS — O09899 Supervision of other high risk pregnancies, unspecified trimester: Secondary | ICD-10-CM

## 2018-05-11 NOTE — Progress Notes (Signed)
   PRENATAL VISIT NOTE  Subjective:  Carrie Herrera is a 27 y.o. G2P1001 at 1369w0d being seen today for ongoing prenatal care.  She is currently monitored for the following issues for this high-risk pregnancy and has Moderate persistent asthma with acute exacerbation; Gestational hypertension without significant proteinuria in third trimester; Anemia in pregnancy; Supervision of other high risk pregnancy, antepartum; Rubella non-immune status, antepartum; Limited prenatal care, antepartum; No leakage of amniotic fluid into vagina; Maternal mitral valve regurgitation affecting pregnancy in second trimester, antepartum; Housing situation unstable; Marijuana use; and Hypertension in pregnancy on their problem list.  Patient reports no complaints.  Contractions: Not present. Vag. Bleeding: None.  Movement: Present. Denies leaking of fluid.   The following portions of the patient's history were reviewed and updated as appropriate: allergies, current medications, past family history, past medical history, past social history, past surgical history and problem list. Problem list updated.  Objective:   Vitals:   05/11/18 0929 05/11/18 0931  BP: (!) 143/79 (!) 142/80  Pulse: 79 80  Weight: 186 lb 14.4 oz (84.8 kg)     Fetal Status: Fetal Heart Rate (bpm): 159   Movement: Present     General:  Alert, oriented and cooperative. Patient is in no acute distress.  Skin: Skin is warm and dry. No rash noted.   Cardiovascular: Normal heart rate noted  Respiratory: Normal respiratory effort, no problems with respiration noted  Abdomen: Soft, gravid, appropriate for gestational age.  Pain/Pressure: Present     Pelvic: Cervical exam performed        Extremities: Normal range of motion.  Edema: None  Mental Status: Normal mood and affect. Normal behavior. Normal judgment and thought content.   Assessment and Plan:  Pregnancy: G2P1001 at 4169w0d  1. Gestational hypertension without significant proteinuria in  third trimester - start weekly testing until IOL  2. Supervision of other high risk pregnancy, antepartum -IOL at 39 weeks  Term labor symptoms and general obstetric precautions including but not limited to vaginal bleeding, contractions, leaking of fluid and fetal movement were reviewed in detail with the patient. Please refer to After Visit Summary for other counseling recommendations.  No follow-ups on file.  No future appointments.  Allie BossierMyra C Jowan Skillin, MD

## 2018-05-11 NOTE — Telephone Encounter (Signed)
Preadmission screen  

## 2018-05-11 NOTE — Progress Notes (Signed)
IOL scheduled for August 1st @ 0630.  Pt notified.

## 2018-05-16 ENCOUNTER — Encounter: Payer: Medicaid - Out of State | Admitting: Medical

## 2018-05-17 ENCOUNTER — Encounter (HOSPITAL_COMMUNITY): Payer: Self-pay

## 2018-05-17 ENCOUNTER — Other Ambulatory Visit: Payer: Medicaid - Out of State

## 2018-05-17 ENCOUNTER — Inpatient Hospital Stay (HOSPITAL_COMMUNITY): Payer: Medicaid - Out of State | Admitting: Anesthesiology

## 2018-05-17 ENCOUNTER — Inpatient Hospital Stay (HOSPITAL_COMMUNITY)
Admission: AD | Admit: 2018-05-17 | Discharge: 2018-05-21 | DRG: 787 | Disposition: A | Discharge status: Home / Self Care | Payer: Medicaid - Out of State | Attending: Family Medicine | Admitting: Family Medicine

## 2018-05-17 ENCOUNTER — Other Ambulatory Visit: Payer: Self-pay

## 2018-05-17 ENCOUNTER — Encounter: Payer: Medicaid - Out of State | Admitting: Obstetrics & Gynecology

## 2018-05-17 DIAGNOSIS — O1414 Severe pre-eclampsia complicating childbirth: Principal | ICD-10-CM | POA: Diagnosis present

## 2018-05-17 DIAGNOSIS — D649 Anemia, unspecified: Secondary | ICD-10-CM | POA: Diagnosis present

## 2018-05-17 DIAGNOSIS — O99824 Streptococcus B carrier state complicating childbirth: Secondary | ICD-10-CM | POA: Diagnosis present

## 2018-05-17 DIAGNOSIS — O9902 Anemia complicating childbirth: Secondary | ICD-10-CM | POA: Diagnosis present

## 2018-05-17 DIAGNOSIS — O99214 Obesity complicating childbirth: Secondary | ICD-10-CM | POA: Diagnosis present

## 2018-05-17 DIAGNOSIS — Z3A38 38 weeks gestation of pregnancy: Secondary | ICD-10-CM

## 2018-05-17 DIAGNOSIS — O9952 Diseases of the respiratory system complicating childbirth: Secondary | ICD-10-CM | POA: Diagnosis present

## 2018-05-17 DIAGNOSIS — E669 Obesity, unspecified: Secondary | ICD-10-CM | POA: Diagnosis present

## 2018-05-17 DIAGNOSIS — Z87891 Personal history of nicotine dependence: Secondary | ICD-10-CM

## 2018-05-17 DIAGNOSIS — O9942 Diseases of the circulatory system complicating childbirth: Secondary | ICD-10-CM | POA: Diagnosis present

## 2018-05-17 DIAGNOSIS — O139 Gestational [pregnancy-induced] hypertension without significant proteinuria, unspecified trimester: Secondary | ICD-10-CM | POA: Diagnosis present

## 2018-05-17 DIAGNOSIS — J45909 Unspecified asthma, uncomplicated: Secondary | ICD-10-CM | POA: Diagnosis present

## 2018-05-17 DIAGNOSIS — Z0371 Encounter for suspected problem with amniotic cavity and membrane ruled out: Secondary | ICD-10-CM

## 2018-05-17 DIAGNOSIS — O09899 Supervision of other high risk pregnancies, unspecified trimester: Secondary | ICD-10-CM

## 2018-05-17 DIAGNOSIS — O0930 Supervision of pregnancy with insufficient antenatal care, unspecified trimester: Secondary | ICD-10-CM

## 2018-05-17 DIAGNOSIS — O1493 Unspecified pre-eclampsia, third trimester: Secondary | ICD-10-CM | POA: Diagnosis present

## 2018-05-17 DIAGNOSIS — I341 Nonrheumatic mitral (valve) prolapse: Secondary | ICD-10-CM | POA: Diagnosis present

## 2018-05-17 DIAGNOSIS — O133 Gestational [pregnancy-induced] hypertension without significant proteinuria, third trimester: Secondary | ICD-10-CM

## 2018-05-17 LAB — COMPREHENSIVE METABOLIC PANEL
ALT: 17 U/L (ref 0–44)
AST: 19 U/L (ref 15–41)
Albumin: 2.9 g/dL — ABNORMAL LOW (ref 3.5–5.0)
Alkaline Phosphatase: 146 U/L — ABNORMAL HIGH (ref 38–126)
Anion gap: 11 (ref 5–15)
BILIRUBIN TOTAL: 0.4 mg/dL (ref 0.3–1.2)
BUN: 5 mg/dL — AB (ref 6–20)
CHLORIDE: 103 mmol/L (ref 98–111)
CO2: 21 mmol/L — ABNORMAL LOW (ref 22–32)
CREATININE: 0.51 mg/dL (ref 0.44–1.00)
Calcium: 9.4 mg/dL (ref 8.9–10.3)
Glucose, Bld: 78 mg/dL (ref 70–99)
Potassium: 4 mmol/L (ref 3.5–5.1)
Sodium: 135 mmol/L (ref 135–145)
TOTAL PROTEIN: 6.5 g/dL (ref 6.5–8.1)

## 2018-05-17 LAB — CBC
HEMATOCRIT: 31.5 % — AB (ref 36.0–46.0)
Hemoglobin: 10.6 g/dL — ABNORMAL LOW (ref 12.0–15.0)
MCH: 30.9 pg (ref 26.0–34.0)
MCHC: 33.7 g/dL (ref 30.0–36.0)
MCV: 91.8 fL (ref 78.0–100.0)
PLATELETS: 296 10*3/uL (ref 150–400)
RBC: 3.43 MIL/uL — ABNORMAL LOW (ref 3.87–5.11)
RDW: 13.5 % (ref 11.5–15.5)
WBC: 8.7 10*3/uL (ref 4.0–10.5)

## 2018-05-17 LAB — RAPID URINE DRUG SCREEN, HOSP PERFORMED
AMPHETAMINES: NOT DETECTED
BENZODIAZEPINES: NOT DETECTED
Barbiturates: NOT DETECTED
Cocaine: NOT DETECTED
Opiates: NOT DETECTED
Tetrahydrocannabinol: POSITIVE — AB

## 2018-05-17 LAB — PROTEIN / CREATININE RATIO, URINE
CREATININE, URINE: 101 mg/dL
Protein Creatinine Ratio: 0.58 mg/mg{Cre} — ABNORMAL HIGH (ref 0.00–0.15)
TOTAL PROTEIN, URINE: 59 mg/dL

## 2018-05-17 LAB — TYPE AND SCREEN
ABO/RH(D): O POS
Antibody Screen: NEGATIVE

## 2018-05-17 LAB — ABO/RH: ABO/RH(D): O POS

## 2018-05-17 LAB — RPR: RPR Ser Ql: NONREACTIVE

## 2018-05-17 MED ORDER — PENICILLIN G POT IN DEXTROSE 60000 UNIT/ML IV SOLN
3.0000 10*6.[IU] | INTRAVENOUS | Status: DC
  Administered 2018-05-17 – 2018-05-18 (×5): 3 10*6.[IU] via INTRAVENOUS
  Filled 2018-05-17 (×8): qty 50

## 2018-05-17 MED ORDER — OXYCODONE-ACETAMINOPHEN 5-325 MG PO TABS: 1.0000 | ORAL_TABLET | ORAL | Status: DC | PRN

## 2018-05-17 MED ORDER — EPHEDRINE 5 MG/ML INJ
10.0000 mg | INTRAVENOUS | Status: DC | PRN
Start: 2018-05-17 — End: 2018-05-18
  Filled 2018-05-17: qty 4

## 2018-05-17 MED ORDER — LACTATED RINGERS IV SOLN
INTRAVENOUS | Status: DC
  Administered 2018-05-17: 17:00:00 via INTRAVENOUS

## 2018-05-17 MED ORDER — PHENYLEPHRINE 40 MCG/ML (10ML) SYRINGE FOR IV PUSH (FOR BLOOD PRESSURE SUPPORT)
80.0000 ug | PREFILLED_SYRINGE | INTRAVENOUS | Status: AC | PRN
  Administered 2018-05-17: 80 ug via INTRAVENOUS
  Administered 2018-05-17: 400 ug via INTRAVENOUS
  Administered 2018-05-17: 80 ug via INTRAVENOUS
  Filled 2018-05-17: qty 10

## 2018-05-17 MED ORDER — HYDROXYZINE HCL 50 MG PO TABS: 50.0000 mg | ORAL_TABLET | Freq: Four times a day (QID) | ORAL | Status: DC | PRN

## 2018-05-17 MED ORDER — FENTANYL 2.5 MCG/ML BUPIVACAINE 1/10 % EPIDURAL INFUSION (WH - ANES)
14.0000 mL/h | INTRAMUSCULAR | Status: DC | PRN
  Administered 2018-05-17 (×2): 14 mL/h via EPIDURAL
  Filled 2018-05-17 (×2): qty 100

## 2018-05-17 MED ORDER — OXYTOCIN 40 UNITS IN LACTATED RINGERS INFUSION - SIMPLE MED
2.5000 [IU]/h | INTRAVENOUS | Status: DC
  Filled 2018-05-17: qty 1000

## 2018-05-17 MED ORDER — FLEET ENEMA 7-19 GM/118ML RE ENEM: 1.0000 | ENEMA | RECTAL | Status: DC | PRN

## 2018-05-17 MED ORDER — LACTATED RINGERS IV SOLN
500.0000 mL | INTRAVENOUS | Status: DC | PRN
  Administered 2018-05-17: 500 mL via INTRAVENOUS

## 2018-05-17 MED ORDER — OXYTOCIN 40 UNITS IN LACTATED RINGERS INFUSION - SIMPLE MED
1.0000 m[IU]/min | INTRAVENOUS | Status: DC
  Administered 2018-05-17: 6 m[IU]/min via INTRAVENOUS
  Administered 2018-05-17: 12 m[IU]/min via INTRAVENOUS
  Administered 2018-05-17: 8 m[IU]/min via INTRAVENOUS
  Administered 2018-05-17: 10 m[IU]/min via INTRAVENOUS
  Administered 2018-05-17: 16 m[IU]/min via INTRAVENOUS
  Administered 2018-05-17: 14 m[IU]/min via INTRAVENOUS
  Administered 2018-05-17: 2 m[IU]/min via INTRAVENOUS
  Administered 2018-05-17: 4 m[IU]/min via INTRAVENOUS
  Filled 2018-05-17: qty 1000

## 2018-05-17 MED ORDER — ONDANSETRON HCL 4 MG/2ML IJ SOLN: 4.0000 mg | Freq: Four times a day (QID) | INTRAMUSCULAR | Status: DC | PRN

## 2018-05-17 MED ORDER — LACTATED RINGERS IV SOLN
500.0000 mL | Freq: Once | INTRAVENOUS | Status: AC
  Administered 2018-05-17: 500 mL via INTRAVENOUS

## 2018-05-17 MED ORDER — OXYCODONE-ACETAMINOPHEN 5-325 MG PO TABS: 2.0000 | ORAL_TABLET | ORAL | Status: DC | PRN

## 2018-05-17 MED ORDER — OXYTOCIN BOLUS FROM INFUSION: 500.0000 mL | Freq: Once | INTRAVENOUS | Status: DC

## 2018-05-17 MED ORDER — DIPHENHYDRAMINE HCL 50 MG/ML IJ SOLN: 12.5000 mg | INTRAMUSCULAR | Status: DC | PRN

## 2018-05-17 MED ORDER — LACTATED RINGERS IV SOLN
500.0000 mL | INTRAVENOUS | Status: DC | PRN
  Administered 2018-05-17: 500 mL via INTRAVENOUS
  Administered 2018-05-18: 1000 mL via INTRAVENOUS

## 2018-05-17 MED ORDER — SODIUM CHLORIDE 0.9 % IV SOLN: 5.0000 10*6.[IU] | Freq: Once | INTRAVENOUS | Status: DC

## 2018-05-17 MED ORDER — PHENYLEPHRINE 40 MCG/ML (10ML) SYRINGE FOR IV PUSH (FOR BLOOD PRESSURE SUPPORT)
80.0000 ug | PREFILLED_SYRINGE | INTRAVENOUS | Status: DC | PRN
  Filled 2018-05-17: qty 10

## 2018-05-17 MED ORDER — ACETAMINOPHEN 325 MG PO TABS: 650.0000 mg | ORAL_TABLET | ORAL | Status: DC | PRN

## 2018-05-17 MED ORDER — ALBUMIN HUMAN 5 % IV SOLN
12.5000 g | Freq: Once | INTRAVENOUS | Status: AC
  Administered 2018-05-17: 12.5 g via INTRAVENOUS
  Filled 2018-05-17: qty 250

## 2018-05-17 MED ORDER — FENTANYL CITRATE (PF) 100 MCG/2ML IJ SOLN: 50.0000 ug | INTRAMUSCULAR | Status: DC | PRN

## 2018-05-17 MED ORDER — SOD CITRATE-CITRIC ACID 500-334 MG/5ML PO SOLN: 30.0000 mL | ORAL | Status: DC | PRN

## 2018-05-17 MED ORDER — MISOPROSTOL 25 MCG QUARTER TABLET
25.0000 ug | ORAL_TABLET | ORAL | Status: DC
  Administered 2018-05-17: 25 ug via VAGINAL
  Filled 2018-05-17 (×2): qty 1

## 2018-05-17 MED ORDER — EPHEDRINE 5 MG/ML INJ
10.0000 mg | INTRAVENOUS | Status: DC | PRN
  Administered 2018-05-17: 10 mg via INTRAVENOUS

## 2018-05-17 MED ORDER — LACTATED RINGERS IV SOLN
INTRAVENOUS | Status: DC
  Administered 2018-05-17: 05:00:00 via INTRAVENOUS

## 2018-05-17 MED ORDER — LIDOCAINE HCL (PF) 1 % IJ SOLN
INTRAMUSCULAR | Status: DC | PRN
  Administered 2018-05-17: 4 mL via EPIDURAL
  Administered 2018-05-17: 6 mL via EPIDURAL

## 2018-05-17 MED ORDER — SODIUM CHLORIDE 0.9 % IV SOLN
5.0000 10*6.[IU] | Freq: Once | INTRAVENOUS | Status: AC
  Administered 2018-05-17: 5 10*6.[IU] via INTRAVENOUS
  Filled 2018-05-17: qty 5

## 2018-05-17 MED ORDER — TERBUTALINE SULFATE 1 MG/ML IJ SOLN
0.2500 mg | Freq: Once | INTRAMUSCULAR | Status: AC | PRN
  Administered 2018-05-18: 1 mg via SUBCUTANEOUS

## 2018-05-17 MED ORDER — OXYTOCIN 40 UNITS IN LACTATED RINGERS INFUSION - SIMPLE MED: 2.5000 [IU]/h | INTRAVENOUS | Status: DC

## 2018-05-17 MED ORDER — SOD CITRATE-CITRIC ACID 500-334 MG/5ML PO SOLN
30.0000 mL | ORAL | Status: DC | PRN
  Administered 2018-05-18: 30 mL via ORAL
  Filled 2018-05-17: qty 15

## 2018-05-17 MED ORDER — ONDANSETRON HCL 4 MG/2ML IJ SOLN
4.0000 mg | Freq: Four times a day (QID) | INTRAMUSCULAR | Status: DC | PRN
  Administered 2018-05-17: 4 mg via INTRAVENOUS
  Filled 2018-05-17 (×2): qty 2

## 2018-05-17 MED ORDER — PENICILLIN G POT IN DEXTROSE 60000 UNIT/ML IV SOLN: 3.0000 10*6.[IU] | INTRAVENOUS | Status: DC

## 2018-05-17 MED ORDER — LIDOCAINE HCL (PF) 1 % IJ SOLN: 30.0000 mL | INTRAMUSCULAR | Status: DC | PRN

## 2018-05-17 MED ORDER — MOMETASONE FURO-FORMOTEROL FUM 200-5 MCG/ACT IN AERO
2.0000 | INHALATION_SPRAY | Freq: Two times a day (BID) | RESPIRATORY_TRACT | Status: DC
Start: 2018-05-17 — End: 2018-05-21
  Administered 2018-05-17 – 2018-05-18 (×4): 2 via RESPIRATORY_TRACT
  Filled 2018-05-17: qty 8.8

## 2018-05-17 NOTE — Progress Notes (Signed)
Dr Mal AmabileBrock notified of Blood pressures and symptoms.  Instructed that all interventions done per protocol.  Orders received.

## 2018-05-17 NOTE — Anesthesia Pain Management Evaluation Note (Signed)
  [facility] Pain Management Visit Note  Patient: Carrie Herrera, 27 y.o., female  "Hello I am a member of the anesthesia team at [facility]. We have an anesthesia team available at all times to provide care throughout the hospital, including epidural management and anesthesia for C-section. I don't know your plan for the delivery whether it a natural birth, water birth, IV sedation, nitrous supplementation, doula or epidural, but we want to meet your pain goals."   1.Was your pain managed to your expectations on prior hospitalizations?   No. The patient had an epid[facility] state at a [facility] by a resident and it didn't relieve her pain as expected. She reports that they turned it off before she delivered.;  2.What is your expectation for pain management during this hospitalization?     Epidural  3.How can we help you reach that goal? Epidural at pain goal.  Record the patient's initial score and the patient's pain goal.   Pain: 2  Pain Goal: 6 [facility] wants you to be able to say your pain was always managed very well.  Carrie Herrera 05/17/2018

## 2018-05-17 NOTE — Anesthesia Procedure Notes (Signed)
Epidural Patient location during procedure: OB Start time: 05/17/2018 10:31 AM End time: 05/17/2018 10:35 AM  Staffing Anesthesiologist: Beryle LatheBrock, Giuseppe Duchemin E, MD Performed: anesthesiologist   Preanesthetic Checklist Completed: patient identified, pre-op evaluation, timeout performed, IV checked, risks and benefits discussed and monitors and equipment checked  Epidural Patient position: sitting Prep: DuraPrep Patient monitoring: continuous pulse ox and blood pressure Approach: midline Location: L2-L3 Injection technique: LOR saline  Needle:  Needle type: Tuohy  Needle gauge: 17 G Needle length: 9 cm Needle insertion depth: 7 cm Catheter size: 19 Gauge Catheter at skin depth: 12 cm Test dose: negative and Other (1% lidocaine)  Additional Notes Patient identified. Risks including, but not limited to, bleeding, infection, nerve damage, paralysis, inadequate analgesia, blood pressure changes, nausea, vomiting, allergic reaction, postpartum back pain, itching, and headache were discussed. Patient expressed understanding and wished to proceed. Sterile prep and drape, including hand hygiene, mask, and sterile gloves were used. The patient was positioned and the spine was prepped. The skin was anesthetized with lidocaine. No paraesthesia or other complication noted. The patient did not experience any signs of intravascular injection such as tinnitus or metallic taste in mouth, nor signs of intrathecal spread such as rapid motor block. Please see nursing notes for vital signs. The patient tolerated the procedure well.   Carrie Herrera, MDReason for block:procedure for pain

## 2018-05-17 NOTE — H&P (Signed)
Carrie Herrera is a 27 y.o. G2P1001 female at [redacted]w[redacted]d presenting for IOL from MAU d/t pre-eclampsia.  Came in for r/o labor, not laboring. Denies visual changes, ruq/epigastric pain, n/v.  Intermittent mild headache since yesterday, hasn't had to take anything, not present right now.  Reports active fetal movement, contractions: irregular, vaginal bleeding: none, membranes: intact. Initiated prenatal care at [facility] at 37wks after transferring from [place].   Most recent u/s 7/9 @ 35wks, EFW 17%, normal AFI.   This pregnancy complicated by: Preeclampsia Asthma Anemia Rubella non-imm Mitral valve regurg  Prenatal History/Complications:  Term uncomplicated SVB x 1  Past Medical History: Past Medical History:  Diagnosis Date  . Asthma   . Chlamydia   . Hypertension    during pregnancy   . Maternal mitral valve regurgitation affecting pregnancy in second trimester, antepartum 04/20/2018   Echo 03/26/18: Mild to moderate mitral valve regurgitation, mild tricuspid regurgitation      . Seizures (HCC)     Past Surgical History: Past Surgical History:  Procedure Laterality Date  . NO PAST SURGERIES      Obstetrical History: OB History    Gravida  2   Para  1   Term  1   Preterm  0   AB  0   Living  1     SAB  0   TAB  0   Ectopic  0   Multiple  0   Live Births  1           Social History: Social History   Socioeconomic History  . Marital status: Single    Spouse name: Not on file  . Number of children: Not on file  . Years of education: Not on file  . Highest education level: Not on file  Occupational History  . Not on file  Social Needs  . Financial resource strain: Not on file  . Food insecurity:    Worry: Not on file    Inability: Not on file  . Transportation needs:    Medical: Not on file    Non-medical: Not on file  Tobacco Use  . Smoking status: Former Smoker    Packs/day: 0.50  . Smokeless tobacco: Never Used  Substance and Sexual Activity  .  Alcohol use: Yes  . Drug use: Yes    Types: Marijuana    Comment: quit earlier in preg  . Sexual activity: Yes  Lifestyle  . Physical activity:    Days per week: Not on file    Minutes per session: Not on file  . Stress: Not on file  Relationships  . Social connections:    Talks on phone: Not on file    Gets together: Not on file    Attends religious service: Not on file    Active member of club or organization: Not on file    Attends meetings of clubs or organizations: Not on file    Relationship status: Not on file  Other Topics Concern  . Not on file  Social History Narrative  . Not on file    Family History: Family History  Problem Relation Age of Onset  . Hypertension Mother   . Seizures Mother   . Stroke Mother   . Diabetes Maternal Grandmother   . Heart disease Maternal Grandmother     Allergies: No Known Allergies  Medications Prior to Admission  Medication Sig Dispense Refill Last Dose  . albuterol (PROVENTIL HFA;VENTOLIN HFA) 108 (90 Base) MCG/ACT inhaler Inhale  2 puffs into the lungs every 6 (six) hours as needed for wheezing or shortness of breath.   Past Month at Unknown time  . metroNIDAZOLE (FLAGYL) 500 MG tablet Take 1 tablet (500 mg total) by mouth 2 (two) times daily. 14 tablet 0 Past Week at Unknown time  . mometasone-formoterol (DULERA) 200-5 MCG/ACT AERO Inhale 2 puffs into the lungs 2 (two) times daily. 8.8 g 1 Past Month at Unknown time  . Omega-3 Fatty Acids (FISH OIL) 1000 MG CAPS Take 2 capsules by mouth daily.    05/17/2018 at Unknown time  . Prenatal MV-Min-FA-Omega-3 (PRENATAL GUMMIES/DHA & FA) 0.4-32.5 MG CHEW Chew 2 tablets by mouth daily.   05/17/2018 at Unknown time    Review of Systems  Pertinent pos/neg as indicated in HPI  Blood pressure (!) 146/92, pulse 69, temperature 98 F (36.7 C), temperature source Oral, resp. rate 19, height 5\' 1"  (1.549 m), weight 85.8 kg (189 lb 4 oz), last menstrual period 05/14/2017. General appearance:  alert, cooperative and no distress Lungs: clear to auscultation bilaterally Heart: regular rate and rhythm Abdomen: gravid, soft, non-tender Extremities: tr edema DTR's 2+  Fetal monitoring: FHR: 125 bpm, variability: moderate,  Accelerations: Present,  decelerations:  Absent Uterine activity: irregular Dilation: 3 Effacement (%): Thick Station: -3 Exam by:: Julien Nordmann, RN Presentation: cephalic   Prenatal labs: ABO, Rh: O/Positive/-- (06/09 0000) Antibody: Negative (06/09 0000) Rubella: Nonimmune (06/09 0000) RPR: Nonreactive (06/09 0000)  HBsAg: Negative (06/09 0000)  HIV: Non-reactive (06/09 0000)  GBS:  Pos  Third trimester A1C 5.0 Genetic screening:  declined Anatomy US: normal  Results for orders placed or performed during the hospital encounter of 05/17/18 (from the past 24 hour(s))  Protein / creatinine ratio, urine   Collection Time: 05/17/18  2:14 AM  Result Value Ref Range   Creatinine, Urine 101.00 mg/dL   Total Protein, Urine 59 mg/dL   Protein Creatinine Ratio 0.58 (H) 0.00 - 0.15 mg/mg[Cre]  CBC   Collection Time: 05/17/18  3:15 AM  Result Value Ref Range   WBC 8.7 4.0 - 10.5 K/uL   RBC 3.43 (L) 3.87 - 5.11 MIL/uL   Hemoglobin 10.6 (L) 12.0 - 15.0 g/dL   HCT 16.1 (L) 09.6 - 04.5 %   MCV 91.8 78.0 - 100.0 fL   MCH 30.9 26.0 - 34.0 pg   MCHC 33.7 30.0 - 36.0 g/dL   RDW 40.9 81.1 - 91.4 %   Platelets 296 150 - 400 K/uL  Comprehensive metabolic panel   Collection Time: 05/17/18  3:15 AM  Result Value Ref Range   Sodium 135 135 - 145 mmol/L   Potassium 4.0 3.5 - 5.1 mmol/L   Chloride 103 98 - 111 mmol/L   CO2 21 (L) 22 - 32 mmol/L   Glucose, Bld 78 70 - 99 mg/dL   BUN 5 (L) 6 - 20 mg/dL   Creatinine, Ser 7.82 0.44 - 1.00 mg/dL   Calcium 9.4 8.9 - 95.6 mg/dL   Total Protein 6.5 6.5 - 8.1 g/dL   Albumin 2.9 (L) 3.5 - 5.0 g/dL   AST 19 15 - 41 U/L   ALT 17 0 - 44 U/L   Alkaline Phosphatase 146 (H) 38 - 126 U/L   Total Bilirubin 0.4 0.3 - 1.2  mg/dL   GFR calc non Af Amer >60 >60 mL/min   GFR calc Af Amer >60 >60 mL/min   Anion gap 11 5 - 15     Assessment:  [redacted]w[redacted]d SIUP  G2P1001  Pre-eclampsia w/o severe features  Cat 1 FHR  GBS  Pos  Mitral valve regurg  Plan:  Admit to BS  IV pain meds/epidural prn active labor  Pitocin per protocol  Anticipate NSVB   Plans to breastfeed  Contraception: undecided  Circumcision: outpatient  Cheral MarkerKimberly R Steffon Gladu CNM, WHNP-BC 05/17/2018, 5:49 AM

## 2018-05-17 NOTE — Progress Notes (Signed)
SVE 2.5cm/thick, FB in place Will d/c Pitocin and give cytotec 25 mcg VA FHT Cat I  Kandra NicolasJulie P. Degele, MD OB Fellow

## 2018-05-17 NOTE — MAU Note (Signed)
SAYS HAS LOWER ABD  PAIN - STARTED 45 MIN AFTER COMING FROM LAUNDROMAT . HAS UPPER ABD PAIN- STARTED IN EMS.   [facility]    Thursday - VE 1 CM .Marland Kitchen.  SAYS HAS NOT SMOKED MARIJUANA SINCE 3 MTHS PREG    IS AN INDUCTION  ON 8-1 .

## 2018-05-17 NOTE — Progress Notes (Signed)
Dr Mal AmabileBrock notified of Blood pressure after ephedrine given and symptoms still persist.  In room to asses patient.

## 2018-05-17 NOTE — Progress Notes (Signed)
LABOR PROGRESS NOTE  Janyth PupaMorgan Kolodny is a 27 y.o. G2P1001 at 6629w6d  admitted for IOL for pre-eclampsia  Subjective: Patient doing well. Denies any concerns.   Objective: BP 138/83   Pulse 98   Temp 97.8 F (36.6 C) (Oral)   Resp 18   Ht 5\' 1"  (1.549 m)   Wt 85.8 kg (189 lb 4 oz)   LMP 05/14/2017   BMI 35.76 kg/m  or  Vitals:   05/17/18 1332 05/17/18 1402 05/17/18 1432 05/17/18 1632  BP: (!) 119/57 126/66 (!) 127/59 138/83  Pulse: 74 75 74 98  Resp: 18 18 18 18   Temp:      TempSrc:      Weight:      Height:        SVE Dilation: 2 Effacement (%): Thick Cervical Position: Posterior Station: -3 Presentation: Vertex Exam by:: Dr Mehreen Azizi Amada KingfisherFHT: baseline rate 135, moderate varibility, +acel, no decel Toco: ctx q 2-3 min  Assessment / Plan: 27 y.o. G2P1001 at 1229w6d here for IOL for pre-eclampsia  Labor: On IV Pitocin. Cervix thick and only 2 cm at internal os. FB placed. May consider d/c Pitocin and give cytotec.  Fetal Wellbeing:  Cat I Pain Control:  epidural Anticipated MOD:  SVD  Frederik PearJulie P Yarelly Kuba, MD 05/17/2018, 4:44 PM

## 2018-05-17 NOTE — Anesthesia Preprocedure Evaluation (Signed)
Anesthesia Evaluation  Patient identified by MRN, date of birth, ID band Patient awake    Reviewed: Allergy & Precautions, NPO status , Patient's Chart, lab work & pertinent test results  History of Anesthesia Complications Negative for: history of anesthetic complications  Airway Mallampati: II  TM Distance: >3 FB Neck ROM: Full    Dental  (+) Dental Advisory Given   Pulmonary asthma , former smoker,    breath sounds clear to auscultation       Cardiovascular hypertension, + Valvular Problems/Murmurs MR  Rhythm:Regular Rate:Normal   '19 TTE - Mild-mod MR, mild TR    Neuro/Psych negative neurological ROS  negative psych ROS   GI/Hepatic negative GI ROS, (+)     substance abuse  marijuana use,   Endo/Other   Obesity   Renal/GU negative Renal ROS  negative genitourinary   Musculoskeletal negative musculoskeletal ROS (+)   Abdominal (+) + obese,   Peds  Hematology  (+) anemia ,   Anesthesia Other Findings   Reproductive/Obstetrics (+) Pregnancy                             Anesthesia Physical Anesthesia Plan  ASA: II  Anesthesia Plan: Epidural   Post-op Pain Management:    Induction:   PONV Risk Score and Plan:   Airway Management Planned: Natural Airway  Additional Equipment: None  Intra-op Plan:   Post-operative Plan:   Informed Consent: I have reviewed the patients History and Physical, chart, labs and discussed the procedure including the risks, benefits and alternatives for the proposed anesthesia with the patient or authorized representative who has indicated his/her understanding and acceptance.     Plan Discussed with: Anesthesiologist  Anesthesia Plan Comments: (Labs reviewed. Platelets acceptable, patient not taking any blood thinning medications. Per RN, FHR tracing reported to be stable enough for sitting procedure. Risks and benefits discussed with  patient, including PDPH, backache, failed epidural, allergic reaction, and nerve injury. Patient expressed understanding and wished to proceed.)        Anesthesia Quick Evaluation

## 2018-05-17 NOTE — Progress Notes (Signed)
LABOR PROGRESS NOTE  Carrie Herrera is a 27 y.o. G2P1001 at 6472w6d  admitted for IOL for pre-eclampsia  Subjective: Patient doing well. Denies any concerns. Recently got epidural  Objective: BP (!) 121/59   Pulse 88   Temp 98 F (36.7 C) (Oral)   Resp 18   Ht 5\' 1"  (1.549 m)   Wt 85.8 kg (189 lb 4 oz)   LMP 05/14/2017   BMI 35.76 kg/m  or  Vitals:   05/17/18 1131 05/17/18 1136 05/17/18 1141 05/17/18 1147  BP: 111/62 (!) 108/52 97/61 (!) 121/59  Pulse: (!) 108 99 89 88  Resp:      Temp:      TempSrc:      Weight:      Height:        Last SVE Dilation: 4 Effacement (%): Thick Cervical Position: Posterior Station: -3 Presentation: Vertex Exam by:: Marius Ditchaniell Wade, RN FHT: baseline rate 150, moderate varibility, +acel, no decel Toco: ctx q 2-3 min  Assessment / Plan: 27 y.o. G2P1001 at 7172w6d here for IOL for pre-eclampsia  Labor: On IV Pitocin. Continue to titrae Fetal Wellbeing:  Cat I Pain Control:  epidural Anticipated MOD:  SVD  Frederik PearJulie P Micaela Stith, MD 05/17/2018, 12:06 PM

## 2018-05-18 ENCOUNTER — Inpatient Hospital Stay (HOSPITAL_COMMUNITY): Admission: RE | Admit: 2018-05-18 | Payer: Medicaid - Out of State | Source: Ambulatory Visit

## 2018-05-18 ENCOUNTER — Encounter (HOSPITAL_COMMUNITY): Payer: Self-pay

## 2018-05-18 ENCOUNTER — Encounter: Payer: Medicaid - Out of State | Admitting: Obstetrics and Gynecology

## 2018-05-18 ENCOUNTER — Encounter (HOSPITAL_COMMUNITY)
Admission: AD | Disposition: A | Discharge status: Home / Self Care | Payer: Self-pay | Source: Home / Self Care | Attending: Family Medicine

## 2018-05-18 DIAGNOSIS — O1404 Mild to moderate pre-eclampsia, complicating childbirth: Secondary | ICD-10-CM

## 2018-05-18 DIAGNOSIS — Z3A39 39 weeks gestation of pregnancy: Secondary | ICD-10-CM

## 2018-05-18 LAB — CREATININE, SERUM
Creatinine, Ser: 0.52 mg/dL (ref 0.44–1.00)
GFR calc Af Amer: >60 mL/min (ref >60)

## 2018-05-18 SURGERY — Surgical Case
Anesthesia: Epidural

## 2018-05-18 MED ORDER — MEPERIDINE HCL 25 MG/ML IJ SOLN: 6.2500 mg | INTRAMUSCULAR | Status: DC | PRN

## 2018-05-18 MED ORDER — DIPHENHYDRAMINE HCL 50 MG/ML IJ SOLN: 12.5000 mg | INTRAMUSCULAR | Status: DC | PRN

## 2018-05-18 MED ORDER — WITCH HAZEL-GLYCERIN EX PADS: 1.0000 "application " | MEDICATED_PAD | CUTANEOUS | Status: DC | PRN

## 2018-05-18 MED ORDER — SODIUM BICARBONATE 8.4 % IV SOLN
INTRAVENOUS | Status: DC | PRN
  Administered 2018-05-18: 5 mL via EPIDURAL
  Administered 2018-05-18: 2 mL via EPIDURAL
  Administered 2018-05-18 (×2): 5 mL via EPIDURAL

## 2018-05-18 MED ORDER — PROMETHAZINE HCL 25 MG/ML IJ SOLN: 6.2500 mg | INTRAMUSCULAR | Status: DC | PRN

## 2018-05-18 MED ORDER — SCOPOLAMINE 1 MG/3DAYS TD PT72
1.0000 | MEDICATED_PATCH | Freq: Once | TRANSDERMAL | Status: DC
  Filled 2018-05-18: qty 1

## 2018-05-18 MED ORDER — SODIUM BICARBONATE 8.4 % IV SOLN
INTRAVENOUS | Status: AC
  Filled 2018-05-18: qty 50

## 2018-05-18 MED ORDER — ONDANSETRON HCL 4 MG/2ML IJ SOLN: 4.0000 mg | Freq: Three times a day (TID) | INTRAMUSCULAR | Status: DC | PRN

## 2018-05-18 MED ORDER — MEASLES, MUMPS & RUBELLA VAC ~~LOC~~ INJ
0.5000 mL | INJECTION | Freq: Once | SUBCUTANEOUS | Status: AC
  Administered 2018-05-21: 0.5 mL via SUBCUTANEOUS
  Filled 2018-05-18 (×2): qty 0.5

## 2018-05-18 MED ORDER — BUPIVACAINE HCL (PF) 0.25 % IJ SOLN
INTRAMUSCULAR | Status: DC | PRN
  Administered 2018-05-18: 30 mL

## 2018-05-18 MED ORDER — SIMETHICONE 80 MG PO CHEW
80.0000 mg | CHEWABLE_TABLET | ORAL | Status: DC
  Administered 2018-05-18 – 2018-05-21 (×4): 80 mg via ORAL
  Filled 2018-05-18 (×2): qty 1

## 2018-05-18 MED ORDER — ZOLPIDEM TARTRATE 5 MG PO TABS: 5.0000 mg | ORAL_TABLET | Freq: Every evening | ORAL | Status: DC | PRN

## 2018-05-18 MED ORDER — MENTHOL 3 MG MT LOZG: 1.0000 | LOZENGE | OROMUCOSAL | Status: DC | PRN

## 2018-05-18 MED ORDER — SIMETHICONE 80 MG PO CHEW: 80.0000 mg | CHEWABLE_TABLET | ORAL | Status: DC | PRN

## 2018-05-18 MED ORDER — NALBUPHINE HCL 10 MG/ML IJ SOLN: 5.0000 mg | INTRAMUSCULAR | Status: DC | PRN

## 2018-05-18 MED ORDER — BUPIVACAINE HCL (PF) 0.25 % IJ SOLN
INTRAMUSCULAR | Status: AC
Start: 2018-05-18 — End: ?
  Filled 2018-05-18: qty 20

## 2018-05-18 MED ORDER — ENOXAPARIN SODIUM 40 MG/0.4ML ~~LOC~~ SOLN
40.0000 mg | SUBCUTANEOUS | Status: DC
  Administered 2018-05-18 – 2018-05-20 (×3): 40 mg via SUBCUTANEOUS
  Filled 2018-05-18 (×3): qty 0.4

## 2018-05-18 MED ORDER — LACTATED RINGERS IV SOLN
INTRAVENOUS | Status: DC | PRN
  Administered 2018-05-18 (×2): via INTRAVENOUS

## 2018-05-18 MED ORDER — ACETAMINOPHEN 325 MG PO TABS
650.0000 mg | ORAL_TABLET | ORAL | Status: DC | PRN
  Administered 2018-05-19 – 2018-05-20 (×3): 650 mg via ORAL
  Filled 2018-05-18 (×4): qty 2

## 2018-05-18 MED ORDER — OXYTOCIN 40 UNITS IN LACTATED RINGERS INFUSION - SIMPLE MED: 2.5000 [IU]/h | INTRAVENOUS | Status: AC

## 2018-05-18 MED ORDER — SODIUM CHLORIDE 0.9 % IV SOLN
2.0000 g | Freq: Four times a day (QID) | INTRAVENOUS | Status: DC
  Administered 2018-05-18: 2 g via INTRAVENOUS
  Filled 2018-05-18: qty 2000
  Filled 2018-05-18: qty 2

## 2018-05-18 MED ORDER — FENTANYL CITRATE (PF) 100 MCG/2ML IJ SOLN
INTRAMUSCULAR | Status: AC
  Filled 2018-05-18: qty 2

## 2018-05-18 MED ORDER — DIBUCAINE 1 % RE OINT: 1.0000 "application " | TOPICAL_OINTMENT | RECTAL | Status: DC | PRN

## 2018-05-18 MED ORDER — SODIUM CHLORIDE 0.9 % IV SOLN
500.0000 mg | Freq: Once | INTRAVENOUS | Status: AC
  Administered 2018-05-18: 500 mg via INTRAVENOUS
  Filled 2018-05-18: qty 500

## 2018-05-18 MED ORDER — DIPHENHYDRAMINE HCL 25 MG PO CAPS: 25.0000 mg | ORAL_CAPSULE | ORAL | Status: DC | PRN

## 2018-05-18 MED ORDER — TETANUS-DIPHTH-ACELL PERTUSSIS 5-2.5-18.5 LF-MCG/0.5 IM SUSP: 0.5000 mL | Freq: Once | INTRAMUSCULAR | Status: DC

## 2018-05-18 MED ORDER — MORPHINE SULFATE (PF) 0.5 MG/ML IJ SOLN
INTRAMUSCULAR | Status: DC | PRN
  Administered 2018-05-18: 4 mg via EPIDURAL

## 2018-05-18 MED ORDER — SODIUM CHLORIDE 0.9 % IR SOLN
Status: DC | PRN
  Administered 2018-05-18: 1

## 2018-05-18 MED ORDER — LACTATED RINGERS IV SOLN
INTRAVENOUS | Status: DC | PRN
  Administered 2018-05-18: 06:00:00 via INTRAVENOUS

## 2018-05-18 MED ORDER — NALOXONE HCL 0.4 MG/ML IJ SOLN: 0.4000 mg | INTRAMUSCULAR | Status: DC | PRN

## 2018-05-18 MED ORDER — NALBUPHINE HCL 10 MG/ML IJ SOLN: 5.0000 mg | Freq: Once | INTRAMUSCULAR | Status: DC | PRN

## 2018-05-18 MED ORDER — ACETAMINOPHEN 500 MG PO TABS
1000.0000 mg | ORAL_TABLET | ORAL | Status: DC | PRN
  Administered 2018-05-18: 1000 mg via ORAL
  Filled 2018-05-18: qty 2

## 2018-05-18 MED ORDER — MORPHINE SULFATE (PF) 0.5 MG/ML IJ SOLN
INTRAMUSCULAR | Status: AC
  Filled 2018-05-18: qty 10

## 2018-05-18 MED ORDER — PRENATAL MULTIVITAMIN CH
1.0000 | ORAL_TABLET | Freq: Every day | ORAL | Status: DC
Start: 2018-05-18 — End: 2018-05-21
  Administered 2018-05-18 – 2018-05-21 (×4): 1 via ORAL
  Filled 2018-05-18 (×4): qty 1

## 2018-05-18 MED ORDER — LACTATED RINGERS IV SOLN
INTRAVENOUS | Status: DC | PRN
  Administered 2018-05-18: 40 [IU] via INTRAVENOUS

## 2018-05-18 MED ORDER — SENNOSIDES-DOCUSATE SODIUM 8.6-50 MG PO TABS
2.0000 | ORAL_TABLET | ORAL | Status: DC
  Administered 2018-05-18 – 2018-05-21 (×3): 2 via ORAL
  Filled 2018-05-18 (×3): qty 2

## 2018-05-18 MED ORDER — CEFOTETAN DISODIUM 2 G IJ SOLR
2.0000 g | Freq: Two times a day (BID) | INTRAMUSCULAR | Status: AC
Start: 2018-05-18 — End: 2018-05-18
  Administered 2018-05-18 (×2): 2 g via INTRAVENOUS
  Filled 2018-05-18 (×2): qty 2

## 2018-05-18 MED ORDER — SCOPOLAMINE 1 MG/3DAYS TD PT72
MEDICATED_PATCH | TRANSDERMAL | Status: DC | PRN
  Administered 2018-05-18: 1 via TRANSDERMAL

## 2018-05-18 MED ORDER — SODIUM CHLORIDE 0.9% FLUSH: 3.0000 mL | INTRAVENOUS | Status: DC | PRN

## 2018-05-18 MED ORDER — DEXAMETHASONE SODIUM PHOSPHATE 10 MG/ML IJ SOLN
INTRAMUSCULAR | Status: DC | PRN
  Administered 2018-05-18: 10 mg via INTRAVENOUS

## 2018-05-18 MED ORDER — LIDOCAINE-EPINEPHRINE (PF) 2 %-1:200000 IJ SOLN
INTRAMUSCULAR | Status: AC
  Filled 2018-05-18: qty 20

## 2018-05-18 MED ORDER — SALINE SPRAY 0.65 % NA SOLN
1.0000 | NASAL | Status: DC | PRN
  Administered 2018-05-18: 1 via NASAL
  Filled 2018-05-18: qty 44

## 2018-05-18 MED ORDER — OXYCODONE HCL 5 MG PO TABS
10.0000 mg | ORAL_TABLET | ORAL | Status: DC | PRN
  Administered 2018-05-19 – 2018-05-21 (×8): 10 mg via ORAL
  Filled 2018-05-18 (×8): qty 2

## 2018-05-18 MED ORDER — SIMETHICONE 80 MG PO CHEW
80.0000 mg | CHEWABLE_TABLET | Freq: Three times a day (TID) | ORAL | Status: DC
  Administered 2018-05-18 – 2018-05-21 (×7): 80 mg via ORAL
  Filled 2018-05-18 (×10): qty 1

## 2018-05-18 MED ORDER — DEXAMETHASONE SODIUM PHOSPHATE 10 MG/ML IJ SOLN
INTRAMUSCULAR | Status: AC
  Filled 2018-05-18: qty 1

## 2018-05-18 MED ORDER — ONDANSETRON HCL 4 MG/2ML IJ SOLN
INTRAMUSCULAR | Status: AC
  Filled 2018-05-18: qty 2

## 2018-05-18 MED ORDER — GENTAMICIN SULFATE 40 MG/ML IJ SOLN
5.0000 mg/kg | Freq: Once | INTRAVENOUS | Status: AC
  Administered 2018-05-18: 320 mg via INTRAVENOUS
  Filled 2018-05-18 (×2): qty 8

## 2018-05-18 MED ORDER — PNEUMOCOCCAL VAC POLYVALENT 25 MCG/0.5ML IJ INJ
0.5000 mL | INJECTION | INTRAMUSCULAR | Status: DC
  Filled 2018-05-18: qty 0.5

## 2018-05-18 MED ORDER — LACTATED RINGERS AMNIOINFUSION
INTRAVENOUS | Status: DC
  Administered 2018-05-18: 05:00:00 via INTRAUTERINE
  Filled 2018-05-18 (×2): qty 1000

## 2018-05-18 MED ORDER — DEXTROSE IN LACTATED RINGERS 5 % IV SOLN
INTRAVENOUS | Status: DC
  Administered 2018-05-18 (×2): via INTRAVENOUS

## 2018-05-18 MED ORDER — FENTANYL CITRATE (PF) 100 MCG/2ML IJ SOLN
25.0000 ug | INTRAMUSCULAR | Status: DC | PRN
  Administered 2018-05-18: 50 ug via INTRAVENOUS

## 2018-05-18 MED ORDER — DIPHENHYDRAMINE HCL 25 MG PO CAPS: 25.0000 mg | ORAL_CAPSULE | Freq: Four times a day (QID) | ORAL | Status: DC | PRN

## 2018-05-18 MED ORDER — NALOXONE HCL 4 MG/10ML IJ SOLN
1.0000 ug/kg/h | INTRAVENOUS | Status: DC | PRN
  Filled 2018-05-18: qty 5

## 2018-05-18 MED ORDER — IBUPROFEN 600 MG PO TABS
600.0000 mg | ORAL_TABLET | Freq: Four times a day (QID) | ORAL | Status: DC
  Administered 2018-05-18 – 2018-05-21 (×13): 600 mg via ORAL
  Filled 2018-05-18 (×13): qty 1

## 2018-05-18 MED ORDER — ALBUTEROL SULFATE (2.5 MG/3ML) 0.083% IN NEBU
2.5000 mg | INHALATION_SOLUTION | Freq: Once | RESPIRATORY_TRACT | Status: AC
  Administered 2018-05-18: 2.5 mg via RESPIRATORY_TRACT
  Filled 2018-05-18: qty 3

## 2018-05-18 MED ORDER — OXYCODONE HCL 5 MG PO TABS
5.0000 mg | ORAL_TABLET | ORAL | Status: DC | PRN
  Administered 2018-05-19: 5 mg via ORAL
  Filled 2018-05-18: qty 1

## 2018-05-18 MED ORDER — BUPIVACAINE HCL (PF) 0.25 % IJ SOLN
INTRAMUSCULAR | Status: AC
  Filled 2018-05-18: qty 10

## 2018-05-18 MED ORDER — ONDANSETRON HCL 4 MG/2ML IJ SOLN
INTRAMUSCULAR | Status: DC | PRN
  Administered 2018-05-18: 4 mg via INTRAVENOUS

## 2018-05-18 MED ORDER — OXYTOCIN 10 UNIT/ML IJ SOLN
INTRAMUSCULAR | Status: AC
  Filled 2018-05-18: qty 4

## 2018-05-18 MED ORDER — SCOPOLAMINE 1 MG/3DAYS TD PT72
MEDICATED_PATCH | TRANSDERMAL | Status: AC
Start: 2018-05-18 — End: ?
  Filled 2018-05-18: qty 1

## 2018-05-18 MED ORDER — COCONUT OIL OIL
1.0000 "application " | TOPICAL_OIL | Status: DC | PRN
  Administered 2018-05-19: 1 via TOPICAL
  Filled 2018-05-18: qty 120

## 2018-05-18 SURGICAL SUPPLY — 33 items
BENZOIN TINCTURE PRP APPL 2/3 (GAUZE/BANDAGES/DRESSINGS) ×3 IMPLANT
CHLORAPREP W/TINT 26ML (MISCELLANEOUS) ×3 IMPLANT
CLAMP CORD UMBIL (MISCELLANEOUS) IMPLANT
CLOSURE WOUND 1/2 X4 (GAUZE/BANDAGES/DRESSINGS) ×1
CLOTH BEACON ORANGE TIMEOUT ST (SAFETY) ×3 IMPLANT
DECANTER SPIKE VIAL GLASS SM (MISCELLANEOUS) ×3 IMPLANT
DRSG OPSITE POSTOP 4X10 (GAUZE/BANDAGES/DRESSINGS) ×3 IMPLANT
ELECT REM PT RETURN 9FT ADLT (ELECTROSURGICAL) ×3
ELECTRODE REM PT RTRN 9FT ADLT (ELECTROSURGICAL) ×1 IMPLANT
EXTRACTOR VACUUM M CUP 4 TUBE (SUCTIONS) IMPLANT
EXTRACTOR VACUUM M CUP 4' TUBE (SUCTIONS)
GLOVE BIOGEL PI IND STRL 7.0 (GLOVE) ×2 IMPLANT
GLOVE BIOGEL PI INDICATOR 7.0 (GLOVE) ×4
GLOVE ECLIPSE 7.0 STRL STRAW (GLOVE) ×6 IMPLANT
GOWN STRL REUS W/TWL LRG LVL3 (GOWN DISPOSABLE) ×6 IMPLANT
KIT ABG SYR 3ML LUER SLIP (SYRINGE) IMPLANT
NEEDLE HYPO 22GX1.5 SAFETY (NEEDLE) ×3 IMPLANT
NEEDLE HYPO 25X5/8 SAFETYGLIDE (NEEDLE) IMPLANT
NS IRRIG 1000ML POUR BTL (IV SOLUTION) ×3 IMPLANT
PACK C SECTION WH (CUSTOM PROCEDURE TRAY) ×3 IMPLANT
PAD ABD 7.5X8 STRL (GAUZE/BANDAGES/DRESSINGS) ×6 IMPLANT
PAD OB MATERNITY 4.3X12.25 (PERSONAL CARE ITEMS) ×3 IMPLANT
PENCIL SMOKE EVAC W/HOLSTER (ELECTROSURGICAL) ×3 IMPLANT
RTRCTR C-SECT PINK 25CM LRG (MISCELLANEOUS) ×3 IMPLANT
SPONGE GAUZE 4X4 12PLY STER LF (GAUZE/BANDAGES/DRESSINGS) ×6 IMPLANT
STRIP CLOSURE SKIN 1/2X4 (GAUZE/BANDAGES/DRESSINGS) ×2 IMPLANT
SUT VIC AB 0 CTX 36 (SUTURE) ×6
SUT VIC AB 0 CTX36XBRD ANBCTRL (SUTURE) ×3 IMPLANT
SUT VIC AB 4-0 KS 27 (SUTURE) ×3 IMPLANT
SYR 30ML LL (SYRINGE) ×3 IMPLANT
TOWEL OR 17X24 6PK STRL BLUE (TOWEL DISPOSABLE) ×3 IMPLANT
TRAY FOLEY W/BAG SLVR 14FR LF (SET/KITS/TRAYS/PACK) ×3 IMPLANT
WATER STERILE IRR 1000ML POUR (IV SOLUTION) ×3 IMPLANT

## 2018-05-18 NOTE — Transfer of Care (Signed)
Immediate Anesthesia Transfer of Care Note  Patient: Carrie Herrera  Procedure(s) Performed: CESAREAN SECTION (N/A )  Patient Location: PACU  Anesthesia Type:Epidural  Level of Consciousness: awake  Airway & Oxygen Therapy: Patient Spontanous Breathing  Post-op Assessment: Report given to RN and Post -op Vital signs reviewed and stable  Post vital signs: stable  Last Vitals:  Vitals Value Taken Time  BP 92/44 05/18/2018  6:36 AM  Temp    Pulse 123 05/18/2018  6:38 AM  Resp 17 05/18/2018  6:38 AM  SpO2 99 % 05/18/2018  6:38 AM  Vitals shown include unvalidated device data.  Last Pain:  Vitals:   05/18/18 0513  TempSrc: Axillary  PainSc:          Complications: No apparent anesthesia complications

## 2018-05-18 NOTE — Anesthesia Postprocedure Evaluation (Signed)
Anesthesia Post Note  Patient: Carrie Herrera  Procedure(s) Performed: CESAREAN SECTION (N/A )     Patient location during evaluation: Mother Baby Anesthesia Type: Epidural Level of consciousness: awake and alert and oriented Pain management: satisfactory to patient Vital Signs Assessment: post-procedure vital signs reviewed and stable Respiratory status: respiratory function stable Cardiovascular status: stable Postop Assessment: no headache, no backache, epidural receding, patient able to bend at knees, no signs of nausea or vomiting and adequate PO intake Anesthetic complications: no    Last Vitals:  Vitals:   05/18/18 1220 05/18/18 1300  BP:  124/62  Pulse:  62  Resp:  16  Temp:  37.8 C  SpO2: 100% 99%    Last Pain:  Vitals:   05/18/18 1300  TempSrc: Oral  PainSc:    Pain Goal:                 Dawon Troop

## 2018-05-18 NOTE — Lactation Note (Signed)
This note was copied from a baby's chart. Lactation Consultation Note  Patient Name: Carrie Herrera NGEXB'MToday's Date: 05/18/2018 Reason for consult: Initial assessment;1st time breastfeeding;Infant < 6lbs;Term  History of drug abuse, Maternal+THC  Visited with P2 Mom of 39 weeks, weighing 5 lbs. 4 oz.  Baby at 8 hrs old.  Mom choosing to breast and formula feed, baby has already had 2 bottles of formula (10 ml).   Offered to assist with positioning and latching baby.  Baby awakened and was cueing.  Once put STS on Mom's chest, baby fell asleep.   Breast massage and hand expression demonstrated.  Colostrum easily expressed.  Colostrum dripped into baby's mouth.  No rooting noted. Assisted with hand expressing about 4 ml colostrum in colostrum container.  Mom with baby STS sleeping.  Last bottle 2 hrs ago, so will attempt to awaken and feed baby at 3 hrs. Set up DEBP and assisted Mom to double pump on initiation setting.  Plan- 1- Keep baby STS as much as possible 2- awaken baby well every 3 hrs if not waking sooner for feeding. 3- offer breast first, followed with 5-10 ml of EBM+/formula by cup/spoon, curved tip syringe or bottle (paced bottle feeding) 4- Pump both breasts for 15 mins on initiation setting, and hand express to collect colostrum 5- ask for help prn.  Lactation brochure left in room.  Mom aware of IP and OP Lactation services.  Mom knows to call prn for assistance.   Interventions Interventions: Breast feeding basics reviewed;Assisted with latch;Skin to skin;Breast massage;Hand express;Pre-pump if needed;Breast compression;Adjust position;Support pillows;Position options;Expressed milk;DEBP  Lactation Tools Discussed/Used Tools: Pump Breast pump type: Double-Electric Breast Pump WIC Program: No Pump Review: Setup, frequency, and cleaning;Milk Storage Initiated by:: Erby Pian Kacey Dysert RN IBCLC Date initiated:: 05/18/18   Consult Status Consult Status: Follow-up Date:  05/19/18 Follow-up type: In-patient    Judee ClaraSmith, Gentry Seeber E 05/18/2018, 3:53 PM

## 2018-05-18 NOTE — Progress Notes (Signed)
Patient's new IV is continually occluding even after multiple flushes.  IV is occluding even when patient is keeping arm straight.  Patient requested a new IV.  [facility] RN was called to start an IV and could not get one, patient refused to have IV done in her right arm.  RN when to restart fluids and explained that we might be able to get one in her right arm.  Another RN attempted to start and was unsuccessful.  Will pass this report on to night shift RN.  Carrie Herrera, [place] N

## 2018-05-18 NOTE — Op Note (Signed)
Preoperative Diagnosis:  IUP @ 4633w0d, fetal intolerance of labor  Postoperative Diagnosis:  Same  Procedure: Primary low transverse cesarean section  Surgeon: Tinnie Gensanya Eldean Klatt, M.D.  Assistant: None  Findings: Viable female infant, APGAR (1 MIN): 8   APGAR (5 MINS): 9   vtx presentation, LOA position  Estimated blood loss: 1000 cc  Complications: None known  Specimens: Placenta to labor and delivery  Reason for procedure: Briefly, the patient is a 27 y.o. G2P1001 2633w0d who presents for IOL due to pre-eclampsia. Became febrile during induction following cytotec and foley balloon. On pitocin 2 mu. Started on Abx, given tylenol, having variables. AROM resulted in occult cord prolapse with replacement of cord into uterus. Severe variables returned. Amnioinfusion started with little effect. After consideration of options and being remote from delivery, patient opted for abdominal delivery.  Procedure: Patient is a to the OR where spinal analgesia was administered. She was then placed in a supine position with left lateral tilt. She received 500 mg of Azithromycin and was on Gentamycin and Ampicillin and SCDs were in place. A timeout was performed. She was prepped and draped in the usual sterile fashion. A Foley catheter was placed in the bladder. A knife was then used to make a Pfannenstiel incision. This incision was carried out to underlying fascia which was divided in the midline with the knife. The incision was extended laterally, sharply.  The rectus was divided in the midline.  The peritoneal cavity was entered bluntly.  Alexis retractor was placed inside the incision.  A knife was used to make a low transverse incision on the uterus. This incision was carried down to the amniotic cavity was entered. Fetus was in LOA position and was brought up out of the incision without difficulty. Delayed Cord clamping x 1 min then was clamped x 2 and cut. Infant given to waiting nurse.  Cord blood was obtained.  Placenta was delivered from the uterus.  Uterus was cleaned with dry lap pads. Uterine incision closed with 0 Vicryl suture in a locked running fashion. A second layer of 0 Vicryl in an imbricating fashion was used to achieve hemostasis. Alexis retractor was removed from the abdomen. Peritoneal closure was done with 0 Vicryl suture.  Fascia is closed with 0 Vicryl suture in a running fashion. Subcutaneous tissue infused with 30cc 0.25% Marcaine.  Subcutaneous closure was performed with 0 plain suture.  Skin closed using 3-0 Vicryl on a Keith needle.  Steri strips applied, followed by pressure dressing.  All instrument, needle and lap counts were correct x 2.  Patient was awake and taken to PACU stable.  Infant remained with mom in couplet care , stable.   Shelbie Proctoranya S PrattMD 05/18/2018 6:37 AM

## 2018-05-18 NOTE — Progress Notes (Signed)
Patient ID: Janyth PupaMorgan Travaglini, female   DOB: [birth year], 27 y.o.   MRN: [id number]  Asked to see patient following SROM. 4-5 minute bradycardia noted. Previously with Triple I and on Abx and having variables. Cord noted and reduced with recovery of FHR. Severe variables noted. IUPC placed and amniinfusion started. FHR watched for next 20 minutes, with little change in depth or length of variable. Cervix remains 5 cm with no descent. Having contractions and severe variables with each q 5 mins. + scalp stim. After discussion with mother, she prefers to proceed with abdominal delivery. Terbutaline given. On Amp and Natasha BenceGent, will add Azithro x 1 and proceed to OR. Risks/benefits given verbally and consent signed.

## 2018-05-18 NOTE — Progress Notes (Signed)
Patient ID: Carrie Herrera, female   DOB: 1991/08/08, 27 y.o.   MRN: [id number]  Feeling some pressure; epidural in place; becoming more difficult to breathe  BP 145/75, P 95 T nl FHR 150s, +accels, ? Variables Ctx q 4-6 mins with Pit at 262mu/min Cx 4/90/vtx -3  IUP@term  Pre-e IOL process  Ordering saline spray for congestion; RT to give breathing tx  Cam HaiSHAW, KIMBERLY CNM 05/18/2018 2:11 AM

## 2018-05-19 LAB — CBC
HCT: 24.5 % — ABNORMAL LOW (ref 36.0–46.0)
HEMOGLOBIN: 8.4 g/dL — AB (ref 12.0–15.0)
MCH: 31.6 pg (ref 26.0–34.0)
MCHC: 34.3 g/dL (ref 30.0–36.0)
MCV: 92.1 fL (ref 78.0–100.0)
Platelets: 234 10*3/uL (ref 150–400)
RBC: 2.66 MIL/uL — AB (ref 3.87–5.11)
RDW: 14 % (ref 11.5–15.5)
WBC: 17.4 10*3/uL — ABNORMAL HIGH (ref 4.0–10.5)

## 2018-05-19 NOTE — Progress Notes (Signed)
Patient told the plan of care. Patient told to ambulate halls and use her incentive spirometer. Pain regimen explained. Patient told to keep up with feedings. Patient told to feed infant every 3 hours . I encouraged her to latch infant first then formula feed. Patient passing gas.

## 2018-05-19 NOTE — Progress Notes (Addendum)
POSTPARTUM PROGRESS NOTE  Post Partum Day 1 Subjective:  Carrie Herrera is a 27 y.o. G2P2002 33w0ds/p pLTCS POD1.  No acute events overnight.  Pt denies problems with ambulating, voiding or po intake.  She denies nausea or vomiting.  Pain is well controlled. Lochia Small. Pt  Endorses that her sanitary napkin is saturated approximately every 8 hours.Pt also denies shortness of breath.    Objective: Patient Vitals for the past 24 hrs:  BP Temp Temp src Pulse Resp SpO2  05/19/18 0618 (!) 120/58 98.7 F (37.1 C) Oral 70 18 98 %  05/19/18 0132 (!) 110/56 98.4 F (36.9 C) Oral 67 18 98 %  05/18/18 2240 120/61 - - 77 - 97 %  05/18/18 2116 (!) 166/82 97.6 F (36.4 C) Axillary 74 18 100 %  05/18/18 1924 - - - - - 98 %  05/18/18 1721 107/65 99 F (37.2 C) Oral 75 - 99 %  05/18/18 1300 124/62 100.1 F (37.8 C) Oral 62 16 99 %  05/18/18 1220 - - - - - 100 %  05/18/18 1115 125/72 99.6 F (37.6 C) Oral 81 16 99 %  05/18/18 1015 127/68 99.8 F (37.7 C) Oral 76 18 100 %    Physical Exam:  General: alert, cooperative and no distress Lochia:normal flow Chest: no respiratory distress, CATB, no wheezing auscultated,  Heart:regular rate  Abdomen: soft, tender around incision,  Uterine Fundus: firm, appropriately tender DVT Evaluation: No calf swelling or tenderness Extremities: no edema  Recent Labs    05/17/18 0315 05/19/18 0605  HGB 10.6* 8.4*  HCT 31.5* 24.5*    Assessment/Plan:  ASSESSMENT: Carrie Fidleris a 27y.o. G2P2002 338w0d/p pltcs POD1 and is recovering well.    MOF: Breastfeeding] MOC: [place]; Outpatient circumcision for female infant Plan for discharge  1W F/U w. Baby Love 4-6W PP f/u   LOS: 2 days   ChLoel DubonnetS3 05/19/2018, 9:08 AM   I have spoken with and examined this patient and agree with resident/PA-S/Med-S/SNM's note and plan of care. VSS, HRR&R, Resp unlabored, Legs neg.  FrNigel BertholdCNM 05/22/2018 10:41 AM

## 2018-05-19 NOTE — Progress Notes (Signed)
Dr. Earlene PlaterWallace aware that patient states her fight foot feels tingly. Homans negative . Will monitor. No new orders.
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
## 2018-05-20 MED ORDER — FERROUS FUMARATE 324 (106 FE) MG PO TABS
1.0000 | ORAL_TABLET | Freq: Two times a day (BID) | ORAL | Status: DC
  Administered 2018-05-20 – 2018-05-21 (×2): 106 mg via ORAL
  Filled 2018-05-20 (×5): qty 1

## 2018-05-20 NOTE — Progress Notes (Signed)
Subjective: Postpartum Day #2: Cesarean Delivery Patient reports incisional pain, tolerating PO and no problems voiding. Breast and bottlefeeding; desires Nexplanon for pp contraception. Pt rec'd Oxy IR recently but still having pain at inc site.  Objective: Vital signs in last 24 hours: Temp:  [98 F (36.7 C)-98.5 F (36.9 C)] 98 F (36.7 C) (08/02 2231) Pulse Rate:  [68-84] 68 (08/02 2231) Resp:  [18] 18 (08/02 2231) BP: (125-130)/(75-84) 130/84 (08/02 2231) SpO2:  [100 %] 100 % (08/02 1450)  Physical Exam:  General: alert and moderate distress Lochia: appropriate Uterine Fundus: firm Incision: dsg intact, dry DVT Evaluation: No evidence of DVT seen on physical exam.  Recent Labs    05/19/18 0605  HGB 8.4*  HCT 24.5*    Assessment/Plan: Status post Cesarean section. Postoperative course complicated by pain control and postop anemia.  Continue current care. Requested RN to add Tylenol to Oxy IR and Kpad ordered. Fe bid. Watch BPs, and plan on d/c 05/21/18.  Cam HaiSHAW, Lowell Makara CNM 05/20/2018, 9:08 AM

## 2018-05-21 MED ORDER — FERROUS FUMARATE 324 (106 FE) MG PO TABS: 1.0000 | ORAL_TABLET | Freq: Two times a day (BID) | ORAL | 0 refills | Status: AC

## 2018-05-21 MED ORDER — OXYCODONE HCL 5 MG PO TABS: 5.0000 mg | ORAL_TABLET | ORAL | 0 refills | Status: AC | PRN

## 2018-05-21 MED ORDER — IBUPROFEN 600 MG PO TABS: 600.0000 mg | ORAL_TABLET | Freq: Four times a day (QID) | ORAL | 0 refills | Status: AC

## 2018-05-21 NOTE — Discharge Summary (Signed)
OB Discharge Summary  Patient Name: Carrie Herrera DOB: 06/28/1991 MRN: [id number]  Date of admission: 05/17/2018 Delivering MD: Reva Bores   Date of discharge: 05/21/2018  Admitting diagnosis: 38 WEEKS CTX BLOODY SHOW Intrauterine pregnancy: [redacted]w[redacted]d     Secondary diagnosis:Active Problems:   Preeclampsia, third trimester   Gestational hypertension  Additional problems:none     Discharge diagnosis: Term Pregnancy Delivered and Preeclampsia (severe)                                                                     Post partum procedures:n/a  Augmentation: Pitocin, Cytotec and Foley Balloon  Complications: None  Hospital course:  Induction of Labor With Cesarean Section  27 y.o. yo G2P2002 at [redacted]w[redacted]d was admitted to the hospital 05/17/2018 for induction of labor. Patient had a labor course significant for maternal temp and variable decels. The patient went for cesarean section due to Non-Reassuring FHR, and delivered a Viable infant,05/18/2018  Membrane Rupture Time/Date: 4:29 AM ,05/18/2018   Details of operation can be found in separate operative Note.  Patient had an uncomplicated postpartum course. She is ambulating, tolerating a regular diet, passing flatus, and urinating well.  Patient is discharged home in stable condition on 05/21/18.                                    Physical exam  Vitals:   05/19/18 2231 05/20/18 1408 05/20/18 2350 05/21/18 0500  BP: 130/84 131/66 (!) 147/86 130/81  Pulse: 68 75 86 80  Resp: 18 18 17    Temp: 98 F (36.7 C) 98.2 F (36.8 C) 98.2 F (36.8 C) 98.4 F (36.9 C)  TempSrc: Oral Oral Oral Oral  SpO2:      Weight:      Height:       General: alert, cooperative and no distress Lochia: appropriate Uterine Fundus: firm Incision: Healing well with no significant drainage, No significant erythema DVT Evaluation: No evidence of DVT seen on physical exam. Labs: Lab Results  Component Value Date   WBC 17.4 (H) 05/19/2018   HGB 8.4 (L)  05/19/2018   HCT 24.5 (L) 05/19/2018   MCV 92.1 05/19/2018   PLT 234 05/19/2018   CMP Latest Ref Rng & Units 05/18/2018  Glucose 70 - 99 mg/dL -  BUN 6 - 20 mg/dL -  Creatinine 4.09 - 8.11 mg/dL 9.14  Sodium 782 - 956 mmol/L -  Potassium 3.5 - 5.1 mmol/L -  Chloride 98 - 111 mmol/L -  CO2 22 - 32 mmol/L -  Calcium 8.9 - 10.3 mg/dL -  Total Protein 6.5 - 8.1 g/dL -  Total Bilirubin 0.3 - 1.2 mg/dL -  Alkaline Phos 38 - 213 U/L -  AST 15 - 41 U/L -  ALT 0 - 44 U/L -    Discharge instruction: per After Visit Summary and "Baby and Me Booklet".  After Visit Meds:  Allergies as of 05/21/2018   No Known Allergies     Medication List    STOP taking these medications   metroNIDAZOLE 500 MG tablet Commonly known as:  FLAGYL     TAKE these medications  albuterol 108 (90 Base) MCG/ACT inhaler Commonly known as:  PROVENTIL HFA;VENTOLIN HFA Inhale 2 puffs into the lungs every 6 (six) hours as needed for wheezing or shortness of breath.   Ferrous Fumarate 324 (106 Fe) MG Tabs tablet Commonly known as:  HEMOCYTE - 106 mg FE Take 1 tablet (106 mg of iron total) by mouth 2 (two) times daily.   Fish Oil 1000 MG Caps Take 2 capsules by mouth daily.   ibuprofen 600 MG tablet Commonly known as:  ADVIL,MOTRIN Take 1 tablet (600 mg total) by mouth every 6 (six) hours.   mometasone-formoterol 200-5 MCG/ACT Aero Commonly known as:  DULERA Inhale 2 puffs into the lungs 2 (two) times daily.   oxyCODONE 5 MG immediate release tablet Commonly known as:  Oxy IR/ROXICODONE Take 1 tablet (5 mg total) by mouth every 4 (four) hours as needed (pain scale 4-7).   PRENATAL GUMMIES/DHA & FA 0.4-32.5 MG Chew Chew 2 tablets by mouth daily.       Diet: routine diet  Activity: Advance as tolerated. Pelvic rest for 6 weeks.   Outpatient follow up:6 weeks Follow up Appt:No future appointments. Follow up visit: No follow-ups on file.  Postpartum contraception: Undecided  Newborn Data: Live  born female  Birth Weight: 5 lb 4 oz (2380 g) APGAR: 8, 9  Newborn Delivery   Birth date/time:  05/18/2018 05:58:00 Delivery type:  C-Section, Low Transverse Trial of labor:  Yes C-section categorization:  Primary     Baby Feeding: Bottle and Breast Disposition:home with mother   05/21/2018 Carrie Herrera, CNM

## 2018-05-21 NOTE — Lactation Note (Signed)
This note was copied from a baby's chart. Lactation Consultation Note  Patient Name: Carrie Herrera ZOXWR'UToday's Date: 05/21/2018 Reason for consult: Follow-up assessment;1st time breastfeeding;Infant < 6lbs;Term;Hyperbilirubinemia;Other (Comment);Engorgement(BIli lights D/C this am / mom pumping and bottle feeding - milk is in / boarderline engorged )  LC into check mom and baby, dad holding baby and he is sound asleep.  Per mom recently pumped both breast with the DEBP getting 4 oz.  Per mom breast feel real full still. Mom is eating her lunch.  LC recommended her finishing her lunch and LC will have her RN obtain reusable ice packs and to ice for 15- 20 mins  And pump both breast for 15 -20 mins.  LC updated the doc flow sheets per mom, baby last fed at 1215 - LC recommended to mom by 3 1/2  - 4 hours need to feed the baby. Last feeding baby had 90 ml of EBM without any spitting.  MBURN Dickie LaBrooke Joyce aware mom is boarder line engorged and will need icing.     Maternal Data    Feeding Feeding Type: Bottle Fed - Breast Milk Nipple Type: Slow - flow  LATCH Score                   Interventions Interventions: Breast feeding basics reviewed  Lactation Tools Discussed/Used Tools: Pump(see LC note for amount pumped off ) Breast pump type: Double-Electric Breast Pump   Consult Status Consult Status: Follow-up Date: 05/22/18 Follow-up type: In-patient    Carrie Herrera 05/21/2018, 3:47 PM

## 2018-05-22 ENCOUNTER — Ambulatory Visit: Payer: Self-pay

## 2018-05-22 NOTE — Lactation Note (Signed)
This note was copied from a baby's chart. Lactation Consultation Note  Patient Name: Carrie Herrera [place]illa ZOXWR'UToday's Date: 05/22/2018 Reason for consult: Follow-up assessment;Infant < 6lbs;Term Breasts are full with mild engorgement.  Mom is pumping every 3 hours and obtaining 60+ mls.  Her plan is to pump and bottle feed.  Discussed prevention and treatment of engorgement.  Mom plans on purchasing a pump or calling WIC.  Instructed to continue to pump 8-12 times in 24 hours.  Lactation outpatient services and support reviewed and encouraged prn.  Maternal Data    Feeding    LATCH Score                   Interventions    Lactation Tools Discussed/Used     Consult Status Consult Status: Complete Follow-up type: Call as needed    Huston FoleyMOULDEN, Landy Dunnavant S 05/22/2018, 12:22 PM

## 2018-05-30 ENCOUNTER — Ambulatory Visit: Payer: Medicaid Other

## 2018-06-20 ENCOUNTER — Ambulatory Visit: Payer: Medicaid Other | Admitting: Obstetrics and Gynecology

## 2019-11-04 IMAGING — DX DG CHEST 1V PORT
1 series · 1 of 1 positions shown · non-contrast
Comparison: 05/31/2017 and prior radiographs

CLINICAL DATA: Cough and congestion for 3 days.

EXAM:
PORTABLE CHEST 1 VIEW

[chest ap]
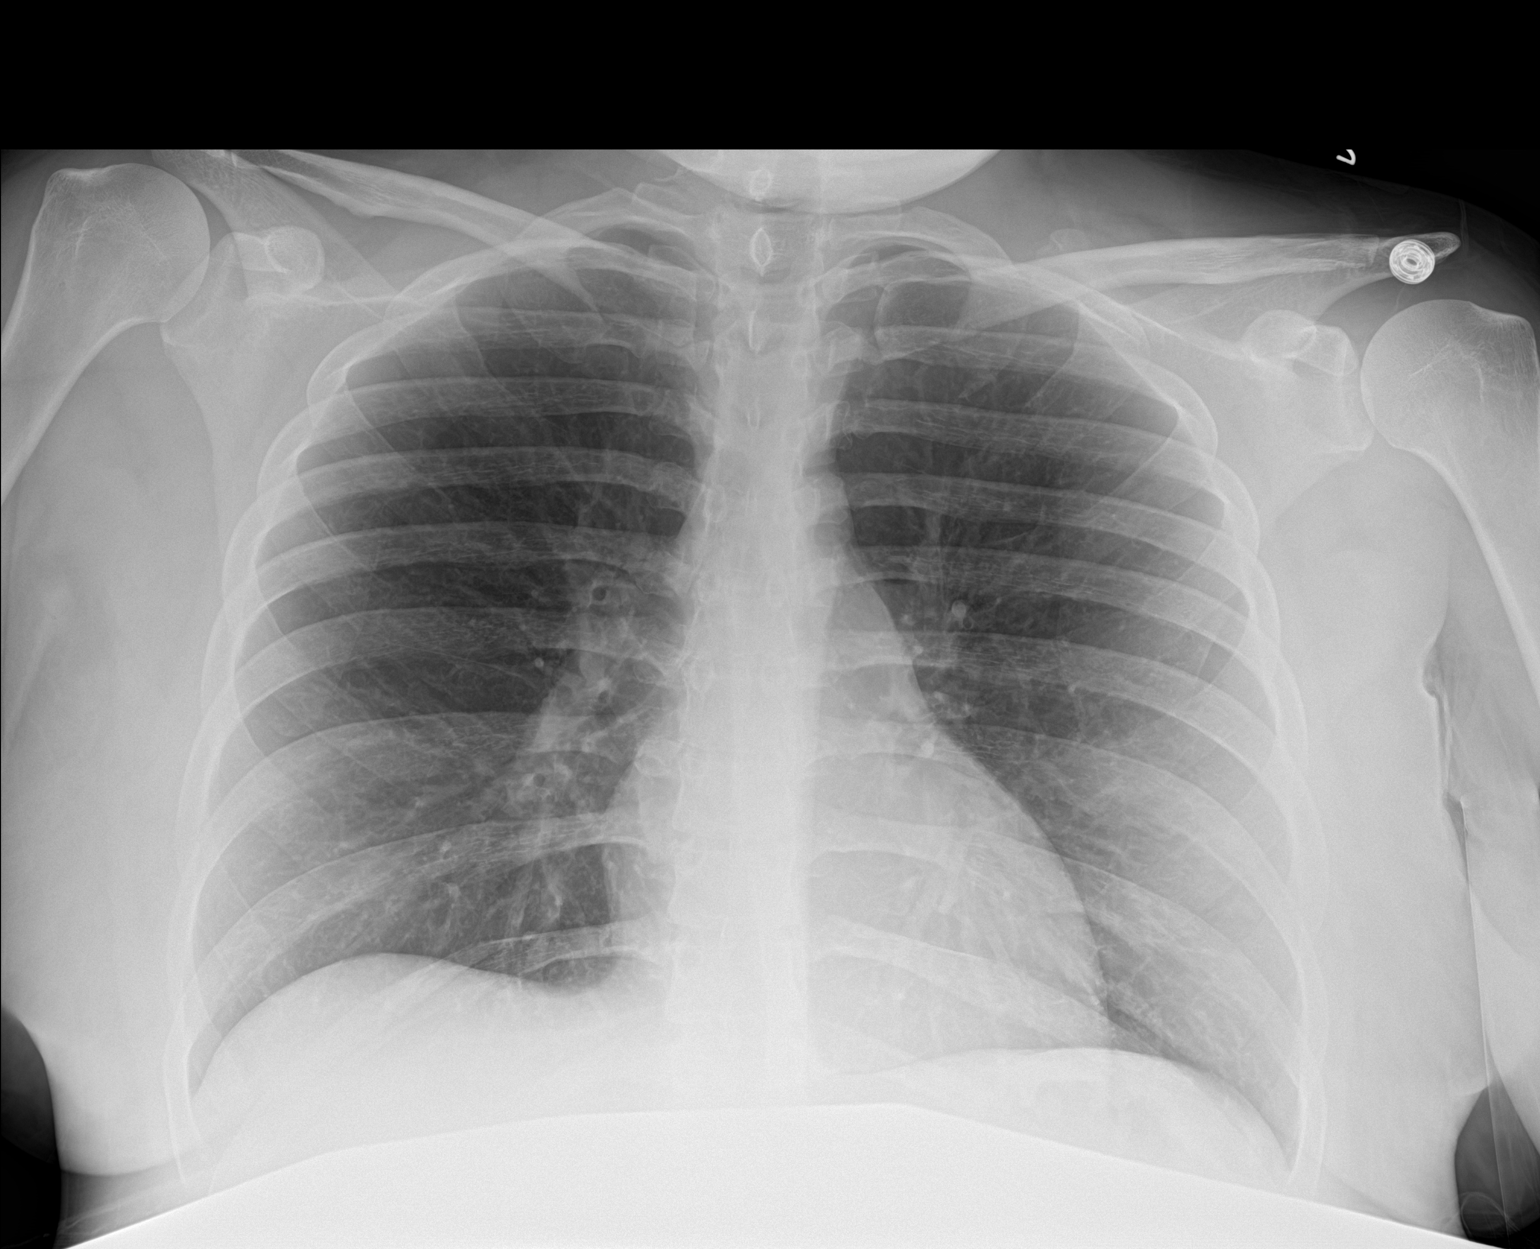

[1 of 1 positions shown; findings below may reference images not displayed]

FINDINGS: The cardiomediastinal silhouette is unremarkable.

There is no evidence of focal airspace disease, pulmonary edema,
suspicious pulmonary nodule/mass, pleural effusion, or pneumothorax.

No acute bony abnormalities are identified.
IMPRESSION: No active disease.
